# Patient Record
Sex: Male | Born: 1955 | Race: Asian | Hispanic: No | Marital: Married | Smoking: Never smoker
Health system: Southern US, Community
[De-identification: ages and names within clinical notes are randomized; demographics above are authoritative.]

## PROBLEM LIST (undated history)

## (undated) DIAGNOSIS — N400 Enlarged prostate without lower urinary tract symptoms: Secondary | ICD-10-CM

## (undated) DIAGNOSIS — R972 Elevated prostate specific antigen [PSA]: Secondary | ICD-10-CM

## (undated) DIAGNOSIS — I10 Essential (primary) hypertension: Secondary | ICD-10-CM

## (undated) DIAGNOSIS — N179 Acute kidney failure, unspecified: Secondary | ICD-10-CM

## (undated) DIAGNOSIS — R7303 Prediabetes: Secondary | ICD-10-CM

## (undated) HISTORY — PX: OTHER SURGICAL HISTORY: SHX169

---

## 2008-09-16 HISTORY — PX: COLONOSCOPY: SHX174

## 2009-06-05 ENCOUNTER — Ambulatory Visit: Payer: Self-pay | Admitting: Gastroenterology

## 2013-08-19 ENCOUNTER — Ambulatory Visit: Payer: Self-pay | Admitting: Internal Medicine

## 2015-04-06 ENCOUNTER — Encounter: Payer: Self-pay | Admitting: Urology

## 2015-04-06 ENCOUNTER — Telehealth: Payer: Self-pay | Admitting: Urology

## 2015-04-06 NOTE — Telephone Encounter (Signed)
Received referral to urology from [facility] for elevated PSA's. Called patient to schedule appointment. No answer. Left message for him to call back to schedule same.

## 2015-07-07 ENCOUNTER — Ambulatory Visit: Payer: Self-pay | Admitting: Urology

## 2015-07-13 ENCOUNTER — Ambulatory Visit (INDEPENDENT_AMBULATORY_CARE_PROVIDER_SITE_OTHER): Payer: BLUE CROSS/BLUE SHIELD | Admitting: Urology

## 2015-07-13 VITALS — BP 144/81 | HR 83 | Ht 70.0 in | Wt 160.9 lb

## 2015-07-13 DIAGNOSIS — R972 Elevated prostate specific antigen [PSA]: Secondary | ICD-10-CM | POA: Diagnosis not present

## 2015-07-13 LAB — MICROSCOPIC EXAMINATION
Bacteria, UA: NONE SEEN
Epithelial Cells (non renal): NONE SEEN /hpf (ref 0–10)
RBC MICROSCOPIC, UA: NONE SEEN /HPF (ref 0–?)
Renal Epithel, UA: NONE SEEN /hpf
WBC UA: NONE SEEN /HPF (ref 0–?)

## 2015-07-13 LAB — URINALYSIS, COMPLETE
Bilirubin, UA: NEGATIVE
GLUCOSE, UA: NEGATIVE
KETONES UA: NEGATIVE
Leukocytes, UA: NEGATIVE
NITRITE UA: NEGATIVE
Protein, UA: NEGATIVE
RBC UA: NEGATIVE
SPEC GRAV UA: 1.015 (ref 1.005–1.030)
UUROB: 0.2 mg/dL (ref 0.2–1.0)
pH, UA: 6 (ref 5.0–7.5)

## 2015-07-13 NOTE — Progress Notes (Signed)
07/13/2015 9:15 AM   Oletta Darter 1956/08/09 [id number]  Referring provider: Sherrin Daisy, MD [place], [place] [id number]  Chief Complaint  Patient presents with  . Elevated PSA    New Patient    HPI: The patient is a 59 year old male who presents with an elevated PSA of 4.42.     PMH: History reviewed. No pertinent past medical history.  Surgical History: Past Surgical History  Procedure Laterality Date  . Colonoscopy  2010    polyp removal  . Polyp removal      Home Medications:    Medication List    Notice  As of 07/13/2015  9:15 AM   You have not been prescribed any medications.      Allergies: No Known Allergies  Family History: Family History  Problem Relation Age of Onset  . Prostate cancer Neg Hx   . Bladder Cancer Neg Hx   . Kidney cancer Neg Hx   . Urolithiasis Neg Hx     Social History:  reports that he has never smoked. He does not have any smokeless tobacco history on file. He reports that he does not drink alcohol or use illicit drugs.  ROS: UROLOGY Frequent Urination?: Yes Hard to postpone urination?: No Burning/pain with urination?: No Get up at night to urinate?: Yes Leakage of urine?: No Urine stream starts and stops?: No Trouble starting stream?: No Do you have to strain to urinate?: No Blood in urine?: No Urinary tract infection?: No Sexually transmitted disease?: No Injury to kidneys or bladder?: No Painful intercourse?: No Weak stream?: No Erection problems?: No Penile pain?: No  Gastrointestinal Nausea?: No Vomiting?: No Indigestion/heartburn?: No Diarrhea?: No Constipation?: No  Constitutional Fever: No Night sweats?: No Weight loss?: No Fatigue?: No  Skin Skin rash/lesions?: No Itching?: No  Eyes Blurred vision?: No Double vision?: No  Ears/Nose/Throat Sore throat?: Yes Sinus problems?: No  Hematologic/Lymphatic Swollen glands?: No Easy bruising?: No  Cardiovascular Leg  swelling?: No Chest pain?: No  Respiratory Cough?: Yes Shortness of breath?: No  Endocrine Excessive thirst?: No  Musculoskeletal Back pain?: No Joint pain?: No  Neurological Headaches?: No Dizziness?: No  Psychologic Depression?: No Anxiety?: No  Physical Exam: BP 144/81 mmHg  Pulse 83  Ht 5\' 10"  (1.778 m)  Wt 160 lb 14.4 oz (72.984 kg)  BMI 23.09 kg/m2  Constitutional:  Alert and oriented, No acute distress. HEENT: [place] AT, moist mucus membranes.  Trachea midline, no masses. Cardiovascular: No clubbing, cyanosis, or edema. Respiratory: Normal respiratory effort, no increased work of breathing. GI: Abdomen is soft, nontender, nondistended, no abdominal masses GU: No CVA tenderness. Normal phallus. Testicles descended equally bilaterally. Nontender to palpation. Patient refuses DRE at this time. Skin: No rashes, bruises or suspicious lesions. Lymph: No cervical or inguinal adenopathy. Neurologic: Grossly intact, no focal deficits, moving all 4 extremities. Psychiatric: Normal mood and affect.  Laboratory Data: No results found for: WBC, HGB, HCT, MCV, PLT  No results found for: CREATININE  No results found for: PSA  No results found for: TESTOSTERONE  No results found for: HGBA1C  Urinalysis No results found for: COLORURINE, APPEARANCEUR, LABSPEC, PHURINE, GLUCOSEU, HGBUR, BILIRUBINUR, KETONESUR, PROTEINUR, UROBILINOGEN, NITRITE, LEUKOCYTESUR  Assessment & Plan:   I discussed with the patient the risks, benefits, and indications of a PSA testing. He is aware that it is a controversial tests with guidelines ranging from not checking PSA to screening healthy individuals between age 30 and 35. I also discussed with him that  the next step if he wished to proceed would be a prostate biopsy. I discussed the risks, benefits, and indications of this including but not limited to bleeding and infection. At this time he wishes not to proceed with a prostate biopsy. We'll  have him follow-up in one year for repeat PSA.  1. Elevated PSA -repeat PSA in one year   Return in about 1 year (around 07/12/2016) for with repeat PSA prior.  Nickie Retort, MD  [facility] [address] [place], [place] [address] [phone]

## 2019-12-06 DIAGNOSIS — R972 Elevated prostate specific antigen [PSA]: Secondary | ICD-10-CM | POA: Insufficient documentation

## 2019-12-06 DIAGNOSIS — R7303 Prediabetes: Secondary | ICD-10-CM | POA: Insufficient documentation

## 2020-01-24 ENCOUNTER — Other Ambulatory Visit: Payer: Self-pay

## 2020-06-16 ENCOUNTER — Encounter: Payer: Self-pay | Admitting: Urology

## 2020-06-16 ENCOUNTER — Other Ambulatory Visit: Payer: Self-pay

## 2020-06-16 ENCOUNTER — Ambulatory Visit (INDEPENDENT_AMBULATORY_CARE_PROVIDER_SITE_OTHER): Payer: Self-pay | Admitting: Urology

## 2020-06-16 VITALS — BP 130/76 | HR 82 | Ht 70.0 in | Wt 155.0 lb

## 2020-06-16 DIAGNOSIS — R972 Elevated prostate specific antigen [PSA]: Secondary | ICD-10-CM

## 2020-06-16 NOTE — Patient Instructions (Signed)

## 2020-06-16 NOTE — Progress Notes (Signed)
06/16/2020 9:28 AM   Mario Valdez 12/27/1955 [id number]  Referring provider: Toni Arthurs, NP [address] [place],  [place] [address]  Chief Complaint  Patient presents with  . Elevated PSA    New Patient    HPI:  Kidney was referred over for rising PSA.  His March 2021 PSA was 7.09 and repeat September 2021 was 8.62. No prostate biopsy. No h/o BPH. No FH of prostate cancer.   He voids with a good stream. He has some frequency. Noc x 1-2. Occ urgency after fluids.   No medications. 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   PMH: History reviewed. No pertinent past medical history.  Surgical History: Past Surgical History:  Procedure Laterality Date  . COLONOSCOPY  2010   polyp removal  . polyp removal      Home Medications:  Allergies as of 06/16/2020   No Known Allergies     Medication List    as of June 16, 2020  9:28 AM   You have not been prescribed any medications.     Allergies: No Known Allergies  Family History: Family History  Problem Relation Age of Onset  . Prostate cancer Neg Hx   . Bladder Cancer Neg Hx   . Kidney cancer Neg Hx   . Urolithiasis Neg Hx     Social History:  reports that he has never smoked. He has never used smokeless tobacco. He reports that he does not drink alcohol and does not use drugs.   Physical Exam: BP 130/76   Pulse 82   Ht 5\' 10"  (1.778 m)   Wt 155 lb (70.3 kg)   BMI 22.24 kg/m   Constitutional:  Alert and oriented, No acute distress. HEENT: [place] AT, moist mucus membranes.  Trachea midline, no masses. Cardiovascular: No clubbing, cyanosis, or edema. Respiratory: Normal respiratory effort, no increased work of breathing. GI: Abdomen is soft, nontender, nondistended, no abdominal masses GU: No CVA tenderness DRE: Prostate 40 g, smooth without hard area or nodule Lymph: No cervical or inguinal lymphadenopathy. Skin: No rashes, bruises or suspicious lesions. Neurologic: Grossly  intact, no focal deficits, moving all 4 extremities. Psychiatric: Normal mood and affect.  Laboratory Data: No results found for: WBC, HGB, HCT, MCV, PLT  No results found for: CREATININE  No results found for: PSA  No results found for: TESTOSTERONE  No results found for: HGBA1C  Urinalysis    Component Value Date/Time   APPEARANCEUR Clear 07/13/2015 0852   GLUCOSEU Negative 07/13/2015 0852   BILIRUBINUR Negative 07/13/2015 0852   PROTEINUR Negative 07/13/2015 0852   NITRITE Negative 07/13/2015 0852   LEUKOCYTESUR Negative 07/13/2015 0852    Lab Results  Component Value Date   LABMICR See below: 07/13/2015   WBCUA None seen 07/13/2015   RBCUA None seen 07/13/2015   LABEPIT None seen 07/13/2015   BACTERIA None seen 07/13/2015    Pertinent Imaging: n/a No results found for this or any previous visit.  No results found for this or any previous visit.  No results found for this or any previous visit.  No results found for this or any previous visit.  No results found for this or any previous visit.  No results found for this or any previous visit.  No results found for this or any previous visit.  No results found for this or any previous visit.   Assessment & Plan:    1. Elevated PSA  I had  a long discussion with the patient on the nature of elevated PSA - benign vs malignant causes. We discussed age specific levels and that PCa can be seen on a biopsy with very low PSA levels (<=2.5). We discussed the nature risks and benefits of continued surveillance, other lab tests, imaging as well as prostate biopsy. We discussed the management of prostate cancer might include active surveillance or treatment depending on biopsy findings. All questions answered. Will schedule prostate biopsy.   - Urinalysis, Complete   No follow-ups on file.  Festus Aloe, MD  [facility] [address] [place], [place] [address] [phone]

## 2020-06-17 LAB — URINALYSIS, COMPLETE
Bilirubin, UA: NEGATIVE
Glucose, UA: NEGATIVE
Ketones, UA: NEGATIVE
Leukocytes,UA: NEGATIVE
Nitrite, UA: NEGATIVE
Protein,UA: NEGATIVE
RBC, UA: NEGATIVE
Specific Gravity, UA: 1.02 (ref 1.005–1.030)
Urobilinogen, Ur: 0.2 mg/dL (ref 0.2–1.0)
pH, UA: 5.5 (ref 5.0–7.5)

## 2020-06-30 ENCOUNTER — Other Ambulatory Visit: Payer: Self-pay | Admitting: Urology

## 2020-07-14 ENCOUNTER — Encounter: Payer: Self-pay | Admitting: Urology

## 2020-07-14 ENCOUNTER — Ambulatory Visit (INDEPENDENT_AMBULATORY_CARE_PROVIDER_SITE_OTHER): Payer: BC Managed Care – PPO | Admitting: Urology

## 2020-07-14 ENCOUNTER — Other Ambulatory Visit: Payer: Self-pay

## 2020-07-14 ENCOUNTER — Ambulatory Visit: Payer: Self-pay | Admitting: Urology

## 2020-07-14 VITALS — BP 131/69 | HR 89 | Ht 70.0 in | Wt 161.0 lb

## 2020-07-14 DIAGNOSIS — R972 Elevated prostate specific antigen [PSA]: Secondary | ICD-10-CM

## 2020-07-14 MED ORDER — LEVOFLOXACIN 500 MG PO TABS
500.0000 mg | ORAL_TABLET | Freq: Once | ORAL | Status: AC
  Administered 2020-07-14: 500 mg via ORAL

## 2020-07-14 MED ORDER — GENTAMICIN SULFATE 40 MG/ML IJ SOLN
80.0000 mg | Freq: Once | INTRAMUSCULAR | Status: AC
  Administered 2020-07-14: 80 mg via INTRAMUSCULAR

## 2020-07-14 NOTE — Progress Notes (Signed)
Mario Valdez returns for PSA elevation for biopsy. His March 2021 PSA was 7.09 and repeat September 2021 was 8.62. No prior prostate biopsy. No h/o BPH. No FH of prostate cancer.   He voids with a good stream. He has some frequency. Noc x 1-2. Occ urgency after fluids.   No medications. 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 He is an Optometrist.   Prostate Biopsy Procedure   Informed consent was obtained after discussing risks/benefits of the procedure.  A time out was performed to ensure correct patient identity.  Pre-Procedure: - Last PSA Level: No results found for: PSA - Gentamicin given prophylactically - Levaquin 500 mg administered PO -Transrectal Ultrasound performed revealing a 40 gm prostate  -No significant hypoechoic areas  -Enlarged transition zone, median lobe c/w BPH -Prostate measured W 5.5 x  H 5.35 x L 6.4 cm for a volume of 98.59 mL.  Procedure: - Prostate block performed using 10 cc 1% lidocaine and biopsies taken from sextant areas, a total of 12 under ultrasound guidance.  Post-Procedure: - Patient tolerated the procedure well - He was counseled to seek immediate medical attention if experiences any severe pain, significant bleeding, or fevers - Return in one - two week to discuss biopsy results  His exam was normal and prostate looks larger on ultrasound with finding of enlarged median lobe and total volume of 99 g.

## 2020-07-14 NOTE — Patient Instructions (Signed)

## 2020-07-17 LAB — SURGICAL PATHOLOGY

## 2020-07-28 ENCOUNTER — Encounter: Payer: Self-pay | Admitting: Urology

## 2020-07-28 ENCOUNTER — Ambulatory Visit (INDEPENDENT_AMBULATORY_CARE_PROVIDER_SITE_OTHER): Payer: BC Managed Care – PPO | Admitting: Urology

## 2020-07-28 ENCOUNTER — Other Ambulatory Visit: Payer: Self-pay

## 2020-07-28 VITALS — BP 131/78 | HR 83 | Ht 70.0 in | Wt 161.0 lb

## 2020-07-28 DIAGNOSIS — N401 Enlarged prostate with lower urinary tract symptoms: Secondary | ICD-10-CM | POA: Diagnosis not present

## 2020-07-28 DIAGNOSIS — N138 Other obstructive and reflux uropathy: Secondary | ICD-10-CM | POA: Diagnosis not present

## 2020-07-28 DIAGNOSIS — R972 Elevated prostate specific antigen [PSA]: Secondary | ICD-10-CM | POA: Diagnosis not present

## 2020-07-28 DIAGNOSIS — R350 Frequency of micturition: Secondary | ICD-10-CM

## 2020-07-28 NOTE — Progress Notes (Signed)
07/28/2020 10:07 AM   Mario Valdez 03-Oct-1955 [id number]  Referring provider: Sherrin Daisy, MD No address on file  No chief complaint on file.   HPI:  F/u -   1) PSA elevation - biopsy done 07/14/2020 benign, 99 gram prostate. No FH of PCa.   His March 2021 PSA was 7.09 and repeat September 2021 was 8.62.    2) BPH - prostate 99 g on [place] 10/21. He voids with a good stream. He has some frequency. Noc x 1-2. Occ urgency after fluids.   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 He had some blood in semen - clearing.    PMH: No past medical history on file.  Surgical History: Past Surgical History:  Procedure Laterality Date  . COLONOSCOPY  2010   polyp removal  . polyp removal      Home Medications:  Allergies as of 07/28/2020   No Known Allergies     Medication List    as of July 28, 2020 10:07 AM   You have not been prescribed any medications.     Allergies: No Known Allergies  Family History: Family History  Problem Relation Age of Onset  . Prostate cancer Neg Hx   . Bladder Cancer Neg Hx   . Kidney cancer Neg Hx   . Urolithiasis Neg Hx     Social History:  reports that he has never smoked. He has never used smokeless tobacco. He reports that he does not drink alcohol and does not use drugs.   Physical Exam: There were no vitals taken for this visit.  Constitutional:  Alert and oriented, No acute distress. HEENT: [place] AT, moist mucus membranes.  Trachea midline, no masses. Cardiovascular: No clubbing, cyanosis, or edema. Respiratory: Normal respiratory effort, no increased work of breathing. GI: Abdomen is soft, nontender, nondistended, no abdominal masses GU: No CVA tenderness Lymph: No cervical or inguinal lymphadenopathy. Skin: No rashes, bruises or suspicious lesions. Neurologic: Grossly intact, no focal deficits, moving all 4 extremities. Psychiatric: Normal mood and  affect.  Laboratory Data: No results found for: WBC, HGB, HCT, MCV, PLT  No results found for: CREATININE  No results found for: PSA  No results found for: TESTOSTERONE  No results found for: HGBA1C  Urinalysis    Component Value Date/Time   APPEARANCEUR Clear 06/16/2020 0929   GLUCOSEU Negative 06/16/2020 0929   BILIRUBINUR Negative 06/16/2020 0929   PROTEINUR Negative 06/16/2020 0929   NITRITE Negative 06/16/2020 0929   LEUKOCYTESUR Negative 06/16/2020 0929    Lab Results  Component Value Date   LABMICR See below: 07/13/2015   WBCUA None seen 07/13/2015   RBCUA None seen 07/13/2015   LABEPIT None seen 07/13/2015   BACTERIA None seen 07/13/2015    Pertinent Imaging: N/A No results found for this or any previous visit.  No results found for this or any previous visit.  No results found for this or any previous visit.  No results found for this or any previous visit.  No results found for this or any previous visit.  No results found for this or any previous visit.  No results found for this or any previous visit.  No results found for this or any previous visit.   Assessment & Plan:    Elevated PSA-we discussed benign biopsy results.  We discussed importance of continued PSA surveillance and DRE. F/u in 6 mo with PSA prior.   BPH-we discussed the nature  of BPH and the nature risk and benefits of surveillance, medications and procedures.  All questions answered.  He will continue surveillance.  No follow-ups on file.  Festus Aloe, MD  [facility] [address] [place], [place] [address] [phone]

## 2020-07-28 NOTE — Patient Instructions (Signed)

## 2021-01-02 ENCOUNTER — Other Ambulatory Visit: Payer: Self-pay | Admitting: *Deleted

## 2021-01-02 DIAGNOSIS — R972 Elevated prostate specific antigen [PSA]: Secondary | ICD-10-CM

## 2021-01-24 ENCOUNTER — Other Ambulatory Visit: Payer: Self-pay

## 2021-01-26 ENCOUNTER — Ambulatory Visit: Payer: Self-pay | Admitting: Urology

## 2021-04-14 ENCOUNTER — Other Ambulatory Visit: Payer: Self-pay

## 2021-04-14 ENCOUNTER — Emergency Department: Payer: BC Managed Care – PPO

## 2021-04-14 ENCOUNTER — Emergency Department
Admission: EM | Admit: 2021-04-14 | Discharge: 2021-04-14 | Disposition: A | Discharge status: Home / Self Care | Payer: BC Managed Care – PPO | Attending: Emergency Medicine | Admitting: Emergency Medicine

## 2021-04-14 ENCOUNTER — Encounter: Payer: Self-pay | Admitting: Emergency Medicine

## 2021-04-14 DIAGNOSIS — S43112A Subluxation of left acromioclavicular joint, initial encounter: Secondary | ICD-10-CM | POA: Diagnosis not present

## 2021-04-14 DIAGNOSIS — M25512 Pain in left shoulder: Secondary | ICD-10-CM | POA: Insufficient documentation

## 2021-04-14 DIAGNOSIS — Y92410 Unspecified street and highway as the place of occurrence of the external cause: Secondary | ICD-10-CM | POA: Insufficient documentation

## 2021-04-14 DIAGNOSIS — S43102A Unspecified dislocation of left acromioclavicular joint, initial encounter: Secondary | ICD-10-CM

## 2021-04-14 DIAGNOSIS — S4992XA Unspecified injury of left shoulder and upper arm, initial encounter: Secondary | ICD-10-CM | POA: Diagnosis present

## 2021-04-14 DIAGNOSIS — S60511A Abrasion of right hand, initial encounter: Secondary | ICD-10-CM | POA: Insufficient documentation

## 2021-04-14 MED ORDER — OXYCODONE-ACETAMINOPHEN 5-325 MG PO TABS: 1.0000 | ORAL_TABLET | Freq: Four times a day (QID) | ORAL | 0 refills | Status: DC | PRN

## 2021-04-14 MED ORDER — OXYCODONE-ACETAMINOPHEN 5-325 MG PO TABS
1.0000 | ORAL_TABLET | Freq: Once | ORAL | Status: AC
  Administered 2021-04-14: 1 via ORAL
  Filled 2021-04-14: qty 1
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
## 2021-04-14 NOTE — Discharge Instructions (Addendum)
Call make a follow-up appointment with Dr. Mack Guise who is the orthopedist on-call.  His address and contact information is listed on your discharge papers.  This is also the person that you would talk to her about having surgery on your injured finger that occurred with playing with your grandchild.  Ice pack to your shoulder as needed for discomfort.  Pain medication was also sent to your pharmacy.  This is 1 every 6 hours as needed for pain.  Be aware that this medication could cause drowsiness and increase your risk for injury.  Do not drive or operate machinery while taking this medication.
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
## 2021-05-09 DIAGNOSIS — M25512 Pain in left shoulder: Secondary | ICD-10-CM | POA: Insufficient documentation

## 2021-12-18 ENCOUNTER — Encounter: Payer: Self-pay | Admitting: *Deleted

## 2022-01-08 ENCOUNTER — Ambulatory Visit (INDEPENDENT_AMBULATORY_CARE_PROVIDER_SITE_OTHER): Payer: BC Managed Care – PPO | Admitting: Urology

## 2022-01-08 ENCOUNTER — Encounter: Payer: Self-pay | Admitting: Urology

## 2022-01-08 VITALS — Ht 70.0 in | Wt 165.0 lb

## 2022-01-08 DIAGNOSIS — R972 Elevated prostate specific antigen [PSA]: Secondary | ICD-10-CM | POA: Diagnosis not present

## 2022-01-08 NOTE — Progress Notes (Signed)
? ?  01/08/2022 ?1:11 PM  ? ?Mario Valdez ?Oct 01, 1955 ?[id number] ? ?Reason for visit: Follow up elevated PSA, BPH ? ?HPI: ?66 year old male with a long history of elevated PSA who was previously followed by Dr. Junious Silk.  He underwent a prostate biopsy in October 2021 for an elevated PSA of 8.6, which showed a 100 g prostate with all benign tissue.  Dr. Junious Silk recommended 28-month follow-up at that time, but he has not been seen since then. ? ?Most recent PSA 12/10/2021 remained elevated at 12.34, which had increased from his prior value of 8.75 in March 2022. ? ?He denies any significant urinary symptoms aside from some urinary frequency that he has had long-term and is minimally bothersome.  He can have nocturia 1-3 times overnight, but again is minimally bothered. ? ?We reviewed the implications of an elevated PSA and the uncertainty surrounding it. In general, a man's PSA increases with age and is produced by both normal and cancerous prostate tissue. The differential diagnosis for elevated PSA includes BPH, prostate cancer, infection, recent intercourse/ejaculation, recent urethroscopic manipulation (foley placement/cystoscopy) or trauma, and prostatitis.  ? ?Management of an elevated PSA can include observation or prostate biopsy and we discussed this in detail.  We also discussed the role of prostate MRI.  Our goal is to detect clinically significant prostate cancers, and manage with either active surveillance, surgery, or radiation for localized disease. Risks of prostate biopsy include bleeding, infection (including life threatening sepsis), pain, and lower urinary symptoms. Hematuria, hematospermia, and blood in the stool are all common after biopsy and can persist up to 4 weeks.  ? ?We discussed options including a repeat PSA with reflex to free in 6 months or proceeding with prostate MRI.  He has a negative prostate biopsy in 2021 that showed a 100 g prostate which is relatively reassuring in terms of his  PSA density.  Using shared decision making, he opted for repeat PSA with reflex to free in 6 months, and understands the need for potential prostate MRI in the future if PSA continues to rise ? ? ?Billey Co, MD ? ?[facility] ?[address] ?[place], [place] [address] ?[phone] ? ?

## 2022-01-08 NOTE — Patient Instructions (Signed)
Prostate Cancer Screening ? ?Prostate cancer screening is testing that is done to check for the presence of prostate cancer in men. The prostate gland is a walnut-sized gland that is located below the bladder and in front of the rectum in males. The function of the prostate is to add fluid to semen during ejaculation. Prostate cancer is one of the most common types of cancer in men. ?Who should have prostate cancer screening? ?Screening recommendations vary based on age and other risk factors, as well as between the professional organizations who make the recommendations. ?In general, screening is recommended if: ?You are age 1 to 2 and have an average risk for prostate cancer. You should talk with your health care provider about your need for screening and how often screening should be done. Because most prostate cancers are slow growing and will not cause death, screening in this age group is generally reserved for men who have a 101- to 15-year life expectancy. ?You are younger than age 54, and you have these risk factors: ?Having a father, brother, or uncle who has been diagnosed with prostate cancer. The risk is higher if your family member's cancer occurred at an early age or if you have multiple family members with prostate cancer at an early age. ?Being a male who is [place] or is of [place] or sub-Saharan African descent. ?In general, screening is not recommended if: ?You are younger than age 75. ?You are between the ages of 71 and 50 and you have no risk factors. ?You are 81 years of age or older. At this age, the risks that screening can cause are greater than the benefits that it may provide. ?If you are at high risk for prostate cancer, your health care provider may recommend that you have screenings more often or that you start screening at a younger age. ?How is screening for prostate cancer done? ?The recommended prostate cancer screening test is a blood test called the prostate-specific antigen  (PSA) test. PSA is a protein that is made in the prostate. As you age, your prostate naturally produces more PSA. Abnormally high PSA levels may be caused by: ?Prostate cancer. ?An enlarged prostate that is not caused by cancer (benign prostatic hyperplasia, or BPH). This condition is very common in older men. ?A prostate gland infection (prostatitis) or urinary tract infection. ?Certain medicines such as male hormones (like testosterone) or other medicines that raise testosterone levels. ?A rectal exam may be done as part of prostate cancer screening to help provide information about the size of your prostate gland. When a rectal exam is performed, it should be done after the PSA level is drawn to avoid any effect on the results. ?Depending on the PSA results, you may need more tests, such as: ?A physical exam to check the size of your prostate gland, if not done as part of screening. ?Blood and imaging tests. ?A procedure to remove tissue samples from your prostate gland for testing (biopsy). This is the only way to know for certain if you have prostate cancer. ?What are the benefits of prostate cancer screening? ?Screening can help to identify cancer at an early stage, before symptoms start and when the cancer can be treated more easily. ?There is a small chance that screening may lower your risk of dying from prostate cancer. The chance is small because prostate cancer is a slow-growing cancer, and most men with prostate cancer die from a different cause. ?What are the risks of prostate cancer screening? ?The  main risk of prostate cancer screening is diagnosing and treating prostate cancer that would never have caused any symptoms or problems. This is called overdiagnosisand overtreatment. PSA screening cannot tell you if your PSA is high due to cancer or a different cause. A prostate biopsy is the only procedure to diagnose prostate cancer. Even the results of a biopsy may not tell you if your cancer needs to  be treated. Slow-growing prostate cancer may not need any treatment other than monitoring, so diagnosing and treating it may cause unnecessary stress or other side effects. ?Questions to ask your health care provider ?When should I start prostate cancer screening? ?What is my risk for prostate cancer? ?How often do I need screening? ?What type of screening tests do I need? ?How do I get my test results? ?What do my results mean? ?Do I need treatment? ?Where to find more information ?The American Cancer Society: www.cancer.org ?American Urological Association: www.auanet.org ?Contact a health care provider if: ?You have difficulty urinating. ?You have pain when you urinate or ejaculate. ?You have blood in your urine or semen. ?You have pain in your back or in the area of your prostate. ?Summary ?Prostate cancer is a common type of cancer in men. The prostate gland is located below the bladder and in front of the rectum. This gland adds fluid to semen during ejaculation. ?Prostate cancer screening may identify cancer at an early stage, when the cancer can be treated more easily and is less likely to have spread to other areas of the body. ?The prostate-specific antigen (PSA) test is the recommended screening test for prostate cancer, but it has associated risks. ?Discuss the risks and benefits of prostate cancer screening with your health care provider. If you are age 29 or older, the risks that screening can cause are greater than the benefits that it may provide. ?This information is not intended to replace advice given to you by your health care provider. Make sure you discuss any questions you have with your health care provider. ?Document Revised: 02/26/2021 Document Reviewed: 02/26/2021 ?Elsevier Patient Education ? [place]. ? ? ?Prostate-Specific Antigen Test ?Why am I having this test? ?The prostate-specific antigen (PSA) test is a screening test for prostate cancer. It can identify early signs of  prostate cancer, which may allow for early detection and more effective treatment. Your health care provider may recommend that you have a PSA test starting at age 44 or that you have one earlier if you are at higher risk for prostate cancer. You may also have a PSA test: ?To monitor treatment of prostate cancer. ?To check whether prostate cancer has returned after treatment. ?What is being tested? ?This test measures the amount of PSA in your blood. PSA is a protein that is made in the prostate. The prostate naturally produces more PSA as you age, but very high levels may be a sign of a medical condition. ?What kind of sample is taken? ? ?A blood sample is required for this test. It is usually collected by inserting a needle into a blood vessel but can also be collected by sticking a finger with a small needle. Blood for this test should be drawn before having an exam of the prostate that involves digital rectal examination to avoid affecting the results. ?How do I prepare for this test? ?Do not ejaculate starting 24 hours before your test, or as long as told by your health care provider, as this can cause an elevation in PSA. ?Do not  undergo any procedures that require manipulation of the prostate, such as biopsy or surgery, for 6 weeks before the test is done as this can cause an elevation in PSA. ?Tell a health care provider about: ?Any signs you may have of other conditions that can affect PSA levels, such as: ?An enlarged prostate that is not caused by cancer (benign prostatic hyperplasia, or BPH). This condition is very common in older men. ?A prostate or urinary tract infection. ?Any allergies you have. ?All medicines you are taking, including vitamins, herbs, eye drops, creams, and over-the-counter medicines. This also includes: ?Medicines to assist with hair growth, such as finasteride. ?Any recent exposure to a medicine called diethylstilbestrol (DES). ?Medicines such as male hormones (like testosterone)  or other medicines that raise testosterone levels. ?Any bleeding problems you have. ?Any recent procedures you have had, especially any procedures involving the prostate or rectum. ?Any medical conditions y

## 2022-04-22 ENCOUNTER — Other Ambulatory Visit (HOSPITAL_COMMUNITY): Payer: Self-pay | Admitting: Gastroenterology

## 2022-04-22 ENCOUNTER — Other Ambulatory Visit: Payer: Self-pay | Admitting: Gastroenterology

## 2022-04-22 DIAGNOSIS — R198 Other specified symptoms and signs involving the digestive system and abdomen: Secondary | ICD-10-CM

## 2022-05-01 ENCOUNTER — Ambulatory Visit: Payer: BC Managed Care – PPO
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
## 2022-06-10 ENCOUNTER — Inpatient Hospital Stay: Admission: RE | Admit: 2022-06-10 | Payer: BC Managed Care – PPO | Source: Ambulatory Visit

## 2022-06-13 DIAGNOSIS — I10 Essential (primary) hypertension: Secondary | ICD-10-CM | POA: Diagnosis present

## 2022-06-18 ENCOUNTER — Ambulatory Visit
Admission: RE | Admit: 2022-06-18 | Discharge: 2022-06-18 | Disposition: A | Discharge status: Home / Self Care | Payer: BC Managed Care – PPO | Source: Ambulatory Visit | Attending: Gastroenterology | Admitting: Gastroenterology

## 2022-06-18 DIAGNOSIS — R198 Other specified symptoms and signs involving the digestive system and abdomen: Secondary | ICD-10-CM

## 2022-06-26 ENCOUNTER — Other Ambulatory Visit: Payer: Self-pay | Admitting: Nephrology

## 2022-06-26 DIAGNOSIS — N189 Chronic kidney disease, unspecified: Secondary | ICD-10-CM | POA: Diagnosis present

## 2022-06-26 DIAGNOSIS — N1339 Other hydronephrosis: Secondary | ICD-10-CM

## 2022-06-26 DIAGNOSIS — N179 Acute kidney failure, unspecified: Secondary | ICD-10-CM | POA: Diagnosis present

## 2022-06-26 DIAGNOSIS — N400 Enlarged prostate without lower urinary tract symptoms: Secondary | ICD-10-CM | POA: Insufficient documentation

## 2022-06-27 ENCOUNTER — Telehealth: Payer: Self-pay | Admitting: *Deleted

## 2022-06-27 ENCOUNTER — Inpatient Hospital Stay
Admission: EM | Admit: 2022-06-27 | Discharge: 2022-06-30 | DRG: 683 | Disposition: A | Discharge status: Home / Self Care | Payer: BC Managed Care – PPO | Attending: Internal Medicine | Admitting: Internal Medicine

## 2022-06-27 ENCOUNTER — Other Ambulatory Visit: Payer: Self-pay

## 2022-06-27 DIAGNOSIS — R319 Hematuria, unspecified: Secondary | ICD-10-CM | POA: Diagnosis present

## 2022-06-27 DIAGNOSIS — I10 Essential (primary) hypertension: Secondary | ICD-10-CM | POA: Diagnosis not present

## 2022-06-27 DIAGNOSIS — N401 Enlarged prostate with lower urinary tract symptoms: Secondary | ICD-10-CM

## 2022-06-27 DIAGNOSIS — R7303 Prediabetes: Secondary | ICD-10-CM | POA: Diagnosis present

## 2022-06-27 DIAGNOSIS — N400 Enlarged prostate without lower urinary tract symptoms: Secondary | ICD-10-CM | POA: Diagnosis present

## 2022-06-27 DIAGNOSIS — D631 Anemia in chronic kidney disease: Secondary | ICD-10-CM | POA: Diagnosis present

## 2022-06-27 DIAGNOSIS — Z20822 Contact with and (suspected) exposure to covid-19: Secondary | ICD-10-CM | POA: Diagnosis present

## 2022-06-27 DIAGNOSIS — N184 Chronic kidney disease, stage 4 (severe): Secondary | ICD-10-CM | POA: Diagnosis present

## 2022-06-27 DIAGNOSIS — I129 Hypertensive chronic kidney disease with stage 1 through stage 4 chronic kidney disease, or unspecified chronic kidney disease: Secondary | ICD-10-CM | POA: Diagnosis present

## 2022-06-27 DIAGNOSIS — N138 Other obstructive and reflux uropathy: Secondary | ICD-10-CM | POA: Diagnosis present

## 2022-06-27 DIAGNOSIS — M898X9 Other specified disorders of bone, unspecified site: Secondary | ICD-10-CM | POA: Diagnosis present

## 2022-06-27 DIAGNOSIS — R350 Frequency of micturition: Secondary | ICD-10-CM | POA: Diagnosis present

## 2022-06-27 DIAGNOSIS — N320 Bladder-neck obstruction: Principal | ICD-10-CM | POA: Diagnosis present

## 2022-06-27 DIAGNOSIS — R899 Unspecified abnormal finding in specimens from other organs, systems and tissues: Secondary | ICD-10-CM | POA: Diagnosis present

## 2022-06-27 DIAGNOSIS — N189 Chronic kidney disease, unspecified: Secondary | ICD-10-CM | POA: Diagnosis present

## 2022-06-27 DIAGNOSIS — N179 Acute kidney failure, unspecified: Principal | ICD-10-CM | POA: Diagnosis present

## 2022-06-27 LAB — COMPREHENSIVE METABOLIC PANEL
ALT: 11 U/L (ref 0–44)
AST: 18 U/L (ref 15–41)
Albumin: 4.5 g/dL (ref 3.5–5.0)
Alkaline Phosphatase: 45 U/L (ref 38–126)
Anion gap: 7 (ref 5–15)
BUN: 51 mg/dL — ABNORMAL HIGH (ref 8–23)
CO2: 20 mmol/L — ABNORMAL LOW (ref 22–32)
Calcium: 9.3 mg/dL (ref 8.9–10.3)
Chloride: 114 mmol/L — ABNORMAL HIGH (ref 98–111)
Creatinine, Ser: 4.26 mg/dL — ABNORMAL HIGH (ref 0.61–1.24)
GFR, Estimated: 15 mL/min — ABNORMAL LOW (ref >60)
Glucose, Bld: 102 mg/dL — ABNORMAL HIGH (ref 70–99)
Potassium: 5 mmol/L (ref 3.5–5.1)
Sodium: 141 mmol/L (ref 135–145)
Total Bilirubin: 0.7 mg/dL (ref 0.3–1.2)
Total Protein: 8.2 g/dL — ABNORMAL HIGH (ref 6.5–8.1)

## 2022-06-27 LAB — CBC WITH DIFFERENTIAL/PLATELET
Abs Immature Granulocytes: 0.02 10*3/uL (ref 0.00–0.07)
Basophils Absolute: 0.1 10*3/uL (ref 0.0–0.1)
Basophils Relative: 1 %
Eosinophils Absolute: 0.3 10*3/uL (ref 0.0–0.5)
Eosinophils Relative: 5 %
HCT: 32.1 % — ABNORMAL LOW (ref 39.0–52.0)
Hemoglobin: 10.6 g/dL — ABNORMAL LOW (ref 13.0–17.0)
Immature Granulocytes: 0 %
Lymphocytes Relative: 13 %
Lymphs Abs: 0.7 10*3/uL (ref 0.7–4.0)
MCH: 28 pg (ref 26.0–34.0)
MCHC: 33 g/dL (ref 30.0–36.0)
MCV: 84.7 fL (ref 80.0–100.0)
Monocytes Absolute: 0.5 10*3/uL (ref 0.1–1.0)
Monocytes Relative: 9 %
Neutro Abs: 4.1 10*3/uL (ref 1.7–7.7)
Neutrophils Relative %: 72 %
Platelets: 192 10*3/uL (ref 150–400)
RBC: 3.79 MIL/uL — ABNORMAL LOW (ref 4.22–5.81)
RDW: 12 % (ref 11.5–15.5)
WBC: 5.7 10*3/uL (ref 4.0–10.5)
nRBC: 0 % (ref 0.0–0.2)

## 2022-06-27 LAB — SARS CORONAVIRUS 2 BY RT PCR: SARS Coronavirus 2 by RT PCR: NEGATIVE

## 2022-06-27 MED ORDER — SODIUM CHLORIDE 0.9% FLUSH
3.0000 mL | Freq: Two times a day (BID) | INTRAVENOUS | Status: DC
  Administered 2022-06-27 – 2022-06-30 (×5): 3 mL via INTRAVENOUS

## 2022-06-27 MED ORDER — ALBUTEROL SULFATE (2.5 MG/3ML) 0.083% IN NEBU: 2.5000 mg | INHALATION_SOLUTION | RESPIRATORY_TRACT | Status: DC | PRN

## 2022-06-27 MED ORDER — HEPARIN SODIUM (PORCINE) 5000 UNIT/ML IJ SOLN
5000.0000 [IU] | Freq: Three times a day (TID) | INTRAMUSCULAR | Status: DC
  Administered 2022-06-27: 5000 [IU] via SUBCUTANEOUS
  Filled 2022-06-27 (×2): qty 1

## 2022-06-27 MED ORDER — SODIUM CHLORIDE 0.9 % IV BOLUS
1000.0000 mL | Freq: Once | INTRAVENOUS | Status: AC
  Administered 2022-06-27: 1000 mL via INTRAVENOUS

## 2022-06-27 MED ORDER — LIDOCAINE HCL URETHRAL/MUCOSAL 2 % EX GEL
1.0000 | Freq: Once | CUTANEOUS | Status: AC
  Administered 2022-06-27: 1 via URETHRAL
  Filled 2022-06-27: qty 6

## 2022-06-27 MED ORDER — ACETAMINOPHEN 325 MG PO TABS
650.0000 mg | ORAL_TABLET | Freq: Four times a day (QID) | ORAL | Status: DC | PRN
  Administered 2022-06-28: 650 mg via ORAL
  Filled 2022-06-27: qty 2

## 2022-06-27 MED ORDER — TAMSULOSIN HCL 0.4 MG PO CAPS
0.4000 mg | ORAL_CAPSULE | Freq: Every day | ORAL | Status: DC
  Administered 2022-06-27 – 2022-06-30 (×4): 0.4 mg via ORAL
  Filled 2022-06-27 (×4): qty 1

## 2022-06-27 MED ORDER — ACETAMINOPHEN 650 MG RE SUPP: 650.0000 mg | Freq: Four times a day (QID) | RECTAL | Status: DC | PRN

## 2022-06-27 MED ORDER — LACTATED RINGERS IV SOLN: INTRAVENOUS | Status: AC

## 2022-06-27 MED ORDER — AMLODIPINE BESYLATE 5 MG PO TABS
5.0000 mg | ORAL_TABLET | Freq: Every day | ORAL | Status: DC
  Administered 2022-06-27 – 2022-06-30 (×4): 5 mg via ORAL
  Filled 2022-06-27 (×4): qty 1

## 2022-06-27 MED ORDER — MORPHINE SULFATE (PF) 2 MG/ML IV SOLN
2.0000 mg | INTRAVENOUS | Status: DC | PRN
  Filled 2022-06-27: qty 1

## 2022-06-27 NOTE — ED Triage Notes (Signed)
PT has routine tests run by pcp and they found his kidneys were functioning at 16%. Pt has prostate enlargement and has issues urinary. BP elevated as well.  Pt takes valsartan and amlodipine for bp.

## 2022-06-27 NOTE — ED Provider Notes (Signed)
[facility] Provider Note    Event Date/Time   First MD Initiated Contact with Patient 06/27/22 1726     (approximate)   History   Renal Issue   HPI  Mario Valdez is a 66 y.o. male here with renal failure.  The patient arrives after being told to come in from his outside nephrologist.  The patient has reportedly had increasing urinary frequency and urgency for the last several months.  He was seen by PCP recently for hypertension and had a creatinine of over 2.  He had a recheck yesterday and was over 4.  He had a CT scan as an outpatient which showed severe hydronephrosis so he was told to come in for evaluation.  Patient states he has had persistent frequency and sensation that he needs to pee about every 90 minutes, but this has not necessarily acutely worsened.  He does state that his blood pressure is fairly significantly worsened.  He does continue to make urine.  No fevers or chills.  No flank pain.     Physical Exam   Triage Vital Signs: ED Triage Vitals  Enc Vitals Group     BP 06/27/22 1500 (!) 190/133     Pulse Rate 06/27/22 1500 66     Resp 06/27/22 1500 18     Temp 06/27/22 1500 98.5 F (36.9 C)     Temp src --      SpO2 06/27/22 1500 98 %     Weight 06/27/22 1501 165 lb (74.8 kg)     Height 06/27/22 1741 5\' 10"  (1.778 m)     Head Circumference --      Peak Flow --      Pain Score 06/27/22 1501 0     Pain Loc --      Pain Edu? --      Excl. in [place]? --     Most recent vital signs: Vitals:   06/27/22 1500  BP: (!) 190/133  Pulse: 66  Resp: 18  Temp: 98.5 F (36.9 C)  SpO2: 98%     General: Awake, no distress.  CV:  Good peripheral perfusion.  Resp:  Normal effort.  Abd:  No distention.  Moderate suprapubic tenderness with fullness.  No CVA tenderness bilaterally. Other:  Well-appearing and in no distress.   ED Results / Procedures / Treatments   Labs (all labs ordered are listed, but only abnormal results are  displayed) Labs Reviewed  COMPREHENSIVE METABOLIC PANEL - Abnormal; Notable for the following components:      Result Value   Chloride 114 (*)    CO2 20 (*)    Glucose, Bld 102 (*)    BUN 51 (*)    Creatinine, Ser 4.26 (*)    Total Protein 8.2 (*)    GFR, Estimated 15 (*)    All other components within normal limits  CBC WITH DIFFERENTIAL/PLATELET - Abnormal; Notable for the following components:   RBC 3.79 (*)    Hemoglobin 10.6 (*)    HCT 32.1 (*)    All other components within normal limits  SARS CORONAVIRUS 2 BY RT PCR     EKG    RADIOLOGY CT scan reviewed from 10/2 shows severe hydronephrosis and proximal date enlargement with massive bladder dilation   I also independently reviewed and agree with radiologist interpretations.   PROCEDURES:  Critical Care performed: No    MEDICATIONS ORDERED IN ED: Medications  sodium chloride 0.9 % bolus 1,000 mL (has  no administration in time range)  sodium chloride 0.9 % bolus 1,000 mL (has no administration in time range)  tamsulosin (FLOMAX) capsule 0.4 mg (has no administration in time range)  lidocaine (XYLOCAINE) 2 % jelly 1 Application (1 Application Urethral Given by Other 06/27/22 1941)     IMPRESSION / MDM / ASSESSMENT AND PLAN / ED COURSE  I reviewed the triage vital signs and the nursing notes.                               The patient is on the cardiac monitor to evaluate for evidence of arrhythmia and/or significant heart rate changes.   Ddx:  Differential includes the following, with pertinent life- or limb-threatening emergencies considered:  Bladder outlet obstruction with AKI, BPH, stone, prostatitis, prostate cancer, medication effect, neurogenic bladder  Patient's presentation is most consistent with acute presentation with potential threat to life or bodily function.  MDM:  66 yo M with PMHx BPH here with abdominal distension, renal failure. Pt sent after outside lab work showed severe AKI. CT  scan also obtained, reviewed by me, and shows marked hydro and bladder obstruction/distension. Pt is hypertensive here but o/w HDS. Reviewed imaging, foley placed without difficulty with drainage of >2L urine. IVF given and will admit for hydration and monitoring of renal function in setting of AKI and likely post-atn diuresis. Discussed with Dr. Diamantina [facility] of Urology - recommends foley, f/u as outpt 10/17 which is already arranged. Will start flomax.   MEDICATIONS GIVEN IN ED: Medications  sodium chloride 0.9 % bolus 1,000 mL (has no administration in time range)  sodium chloride 0.9 % bolus 1,000 mL (has no administration in time range)  tamsulosin (FLOMAX) capsule 0.4 mg (has no administration in time range)  lidocaine (XYLOCAINE) 2 % jelly 1 Application (1 Application Urethral Given by Other 06/27/22 1941)     Consults:  Urology Dr. Diamantina Providence Hospitalist   EMR reviewed  Reviewed outpt CT, labs, Urology and PCP notes     FINAL CLINICAL IMPRESSION(S) / ED DIAGNOSES   Final diagnoses:  Bladder outlet obstruction  AKI (acute kidney injury) ([place])  Benign prostatic hyperplasia with urinary frequency     Rx / DC Orders   ED Discharge Orders     None        Note:  This document was prepared using Dragon voice recognition software and may include unintentional dictation errors.   Duffy Bruce, MD 06/27/22 [phone]

## 2022-06-27 NOTE — H&P (Signed)
History and Physical    Chief Complaint: Abnormal lab   HISTORY OF PRESENT ILLNESS: Mario Valdez is an 66 y.o. male coming to the emergency for abnormal blood work. Wife at bedside states that he has absolutely no symptoms except frequency. Patient has not gone to primary care doctor and his doctor in several years.  Does not take any medications.  Pt has PMH as below: History reviewed. No pertinent past medical history.   Review of Systems  Genitourinary:  Positive for frequency.  All other systems reviewed and are negative.   No Known Allergies   Past Surgical History:  Procedure Laterality Date   COLONOSCOPY  2010   polyp removal   polyp removal      Social History   Socioeconomic History   Marital status: Married    Spouse name: Not on file   Number of children: 1   Years of education: Not on file   Highest education level: Not on file  Occupational History   Not on file  Tobacco Use   Smoking status: Never   Smokeless tobacco: Never  Substance and Sexual Activity   Alcohol use: No    Alcohol/week: 0.0 standard drinks of alcohol   Drug use: No   Sexual activity: Yes    Birth control/protection: None  Other Topics Concern   Not on file  Social History Narrative   Not on file   Social Determinants of Health   Financial Resource Strain: Not on file  Food Insecurity: Not on file  Transportation Needs: Not on file  Physical Activity: Not on file  Stress: Not on file  Social Connections: Not on file      CURRENT MEDS:    Current Facility-Administered Medications (Cardiovascular):    amLODipine (NORVASC) tablet 5 mg  Current Outpatient Medications (Cardiovascular):    valsartan (DIOVAN) 320 MG tablet, Take 320 mg by mouth daily.   amLODipine (NORVASC) 5 MG tablet, Take 5 mg by mouth daily.  Current Facility-Administered Medications (Respiratory):    albuterol (PROVENTIL) (2.5 MG/3ML) 0.083% nebulizer solution 2.5 mg   Current  Facility-Administered Medications (Analgesics):    acetaminophen (TYLENOL) tablet 650 mg **OR** acetaminophen (TYLENOL) suppository 650 mg   morphine (PF) 2 MG/ML injection 2 mg   Current Facility-Administered Medications (Hematological):    heparin injection 5,000 Units   Current Facility-Administered Medications (Other):    lactated ringers infusion   sodium chloride flush (NS) 0.9 % injection 3 mL   tamsulosin (FLOMAX) capsule 0.4 mg     ED Course: Pt in Ed alert awake oriented afebrile no apparent distress. Vitals:   06/27/22 2345 06/28/22 0000 06/28/22 0015 06/28/22 0030  BP:  (!) 157/99  (!) 148/89  Pulse: 87 85 91 76  Resp:    18  Temp:      SpO2: 99% 100% 100% 100%  Weight:      Height:       Total I/O In: -  Out: 2850 [Urine:2850] SpO2: 100 % Blood work in ed shows patient with acute kidney injury with a creatinine of 4.6, LFTs within normal limits. Anemia with a hemoglobin of 10.6. Results for orders placed or performed during the hospital encounter of 06/27/22 (from the past 24 hour(s))  Comprehensive metabolic panel     Status: Abnormal   Collection Time: 06/27/22  3:07 PM  Result Value Ref Range   Sodium 141 135 - 145 mmol/L   Potassium 5.0 3.5 - 5.1 mmol/L   Chloride 114 (H) 98 -  111 mmol/L   CO2 20 (L) 22 - 32 mmol/L   Glucose, Bld 102 (H) 70 - 99 mg/dL   BUN 51 (H) 8 - 23 mg/dL   Creatinine, Ser 4.26 (H) 0.61 - 1.24 mg/dL   Calcium 9.3 8.9 - 10.3 mg/dL   Total Protein 8.2 (H) 6.5 - 8.1 g/dL   Albumin 4.5 3.5 - 5.0 g/dL   AST 18 15 - 41 U/L   ALT 11 0 - 44 U/L   Alkaline Phosphatase 45 38 - 126 U/L   Total Bilirubin 0.7 0.3 - 1.2 mg/dL   GFR, Estimated 15 (L) >60 mL/min   Anion gap 7 5 - 15  CBC with Differential     Status: Abnormal   Collection Time: 06/27/22  3:07 PM  Result Value Ref Range   WBC 5.7 4.0 - 10.5 K/uL   RBC 3.79 (L) 4.22 - 5.81 MIL/uL   Hemoglobin 10.6 (L) 13.0 - 17.0 g/dL   HCT 32.1 (L) 39.0 - 52.0 %   MCV 84.7 80.0 -  100.0 fL   MCH 28.0 26.0 - 34.0 pg   MCHC 33.0 30.0 - 36.0 g/dL   RDW 12.0 11.5 - 15.5 %   Platelets 192 150 - 400 K/uL   nRBC 0.0 0.0 - 0.2 %   Neutrophils Relative % 72 %   Neutro Abs 4.1 1.7 - 7.7 K/uL   Lymphocytes Relative 13 %   Lymphs Abs 0.7 0.7 - 4.0 K/uL   Monocytes Relative 9 %   Monocytes Absolute 0.5 0.1 - 1.0 K/uL   Eosinophils Relative 5 %   Eosinophils Absolute 0.3 0.0 - 0.5 K/uL   Basophils Relative 1 %   Basophils Absolute 0.1 0.0 - 0.1 K/uL   Immature Granulocytes 0 %   Abs Immature Granulocytes 0.02 0.00 - 0.07 K/uL  Hemoglobin A1c     Status: Abnormal   Collection Time: 06/27/22  3:29 PM  Result Value Ref Range   Hgb A1c MFr Bld 5.7 (H) 4.8 - 5.6 %   Mean Plasma Glucose 116.89 mg/dL  SARS Coronavirus 2 by RT PCR (hospital order, performed in [place] hospital lab) *cepheid single result test* Anterior Nasal Swab     Status: None   Collection Time: 06/27/22  7:37 PM   Specimen: Anterior Nasal Swab  Result Value Ref Range   SARS Coronavirus 2 by RT PCR NEGATIVE NEGATIVE   In Ed pt received: Meds ordered this encounter  Medications   lidocaine (XYLOCAINE) 2 % jelly 1 Application   sodium chloride 0.9 % bolus 1,000 mL   sodium chloride 0.9 % bolus 1,000 mL   tamsulosin (FLOMAX) capsule 0.4 mg   amLODipine (NORVASC) tablet 5 mg   sodium chloride flush (NS) 0.9 % injection 3 mL   OR Linked Order Group    acetaminophen (TYLENOL) tablet 650 mg    acetaminophen (TYLENOL) suppository 650 mg   morphine (PF) 2 MG/ML injection 2 mg   heparin injection 5,000 Units   lactated ringers infusion   albuterol (PROVENTIL) (2.5 MG/3ML) 0.083% nebulizer solution 2.5 mg    Unresulted Labs (From admission, onward)     Start     Ordered   06/28/22 0500  Comprehensive metabolic panel  Tomorrow morning,   STAT        06/27/22 2031   06/28/22 0500  CBC  Tomorrow morning,   STAT        06/27/22 2031   06/28/22 0500  HIV Antibody (routine  testing w rflx)  Once,   R         06/28/22 0500           Admission Imaging : No results found.  Physical Examination: Vitals:   06/27/22 2345 06/28/22 0000 06/28/22 0015 06/28/22 0030  BP:  (!) 157/99  (!) 148/89  Pulse: 87 85 91 76  Temp:      Resp:    18  Height:      Weight:      SpO2: 99% 100% 100% 100%  BMI (Calculated):       Physical Exam Vitals and nursing note reviewed.  Constitutional:      General: He is not in acute distress.    Appearance: Normal appearance. He is not ill-appearing, toxic-appearing or diaphoretic.  HENT:     Head: Normocephalic and atraumatic.     Right Ear: Hearing and external ear normal.     Left Ear: Hearing and external ear normal.     Nose: Nose normal. No nasal deformity.     Mouth/Throat:     Lips: Pink.     Mouth: Mucous membranes are moist.     Tongue: No lesions.     Pharynx: Oropharynx is clear.  Eyes:     Extraocular Movements: Extraocular movements intact.     Pupils: Pupils are equal, round, and reactive to light.  Neck:     Vascular: No carotid bruit.  Cardiovascular:     Rate and Rhythm: Normal rate and regular rhythm.     Pulses: Normal pulses.     Heart sounds: Normal heart sounds.  Pulmonary:     Effort: Pulmonary effort is normal.     Breath sounds: Normal breath sounds.  Abdominal:     General: Bowel sounds are normal. There is no distension.     Palpations: Abdomen is soft. There is no mass.     Tenderness: There is no abdominal tenderness. There is no guarding.     Hernia: No hernia is present.  Musculoskeletal:     Right lower leg: No edema.     Left lower leg: No edema.  Skin:    General: Skin is warm.  Neurological:     General: No focal deficit present.     Mental Status: He is alert and oriented to person, place, and time.     Cranial Nerves: Cranial nerves 2-12 are intact.     Motor: Motor function is intact.  Psychiatric:        Attention and Perception: Attention normal.        Mood and Affect: Mood normal.         Speech: Speech normal.        Behavior: Behavior normal. Behavior is cooperative.        Cognition and Memory: Cognition normal.      Assessment and Plan: * Abnormal laboratory test Patient presenting to [place] with an abnormal lab. Patient was told that his kidneys failure and to come to the hospital. Lab Results  Component Value Date   CREATININE 4.26 (H) 06/27/2022  . Attribute to Diovan related acute kidney injury along with BPH. Patient advised to refrain from using any NSAIDs and also Diovan. Advised about not using NSAIDs prevent further blood pressures reviewed years.   Hypertension Vitals:   06/27/22 1500 06/27/22 2300 06/27/22 2330 06/28/22 0000  BP: (!) 190/133 (!) 176/106 (!) 173/90 (!) 157/99   06/28/22 0030  BP: (!) 148/89   Cont amlodipine and  pRN hydralazine.   Acute kidney failure, unspecified ([place]) Patient presenting with acute kidney injury with a creatinine of 4.26. At attribute decreased p.o. intake BPH and ARB.  Lab Results  Component Value Date   CREATININE 4.26 (H) 06/27/2022   Avoid any nephrotoxic's contrast studies   DVT prophylaxis:  Heparin    Code Status:  Full Code    Family Communication:  NATURE, VOGELSANG (Spouse)  [phone] (Home Phone)   Disposition Plan:  Home    Consults called:  Urology Dr. Diamantina Providence  Admission status: Inpatient.    Unit/ Expected LOS: Med tele. / 2 days.    Para Skeans MD [facility]  6 PM- 2 AM. Please contact me via secure Chat 6 PM-2 AM. [phone] ( Pager ) To contact the [facility] Attending or Consulting provider [place] or covering provider during after hours [place], for this patient.   Check the care team in [facility] and look for a) attending/consulting [facility] provider listed and b) the [facility] team listed Log into [email or web address] and use [place] universal password to access. If you do not have the password, please contact the hospital operator. Locate the [facility] provider you are looking for under  [facility] and page to a number that you can be directly reached. If you still have difficulty reaching the provider, please page the [facility] (Director on Call) for the Hospitalists listed on amion for assistance. [email or web address] 06/28/2022, 2:13 AM

## 2022-06-27 NOTE — Telephone Encounter (Signed)
Spoke with patient and son and they don't want pt to go into renal failure. Per pt and son pt should be seen today after speaking with Dr. Lanora Manis. I advised son and pt that they need to let Dr. Lanora Manis know he can't be seen today and ask if he needs to go to the ER. Son agreed and will call Dr. Lillia Dallas office.

## 2022-06-27 NOTE — ED Notes (Signed)
Foley inserted, 1710mL urine out. Foley clamped halfway through to slow draining. Dr. Ellender Hose notified.

## 2022-06-27 NOTE — Hospital Course (Addendum)
Mr. Mccready was admitted to the hospital with the working diagnosis of AKI on CKD stage IV.   66 year old man with hypertension,elevated PSA, BPH previous biopsy in 2021-continue with urology every 6 months, hypertension who has been having issue with urination at home and her blood work done by PCP found to have renal failure creatinine 4.0 BUN 48, hemoglobin 10.6 g 06/26/22. and sent to the ED. he has not gone to her primary care doctor in several years. On his initial physical examination his blood pressure was 157/99, HR 85, RR18 and 02 saturation 100%, lungs with no wheezing or rales, heart with S1 and S2 present and rhythmic, abdomen with no distention, no lower extremity edema.   Na 141, K 5,0 cl 114 bicarbonate 20 glucose 102 bun 51 cr 4,26  Wbc 5,7 hgb 10,6 plt 192  Sars covid 19 negative   CT abdomen and pelvis with severe bilateral hydronephrosis to a massively distended urinary bladder with urinary bladder trabecular thickening and diverticula. Chronic outlet bladder obstruction. Prostatomegaly with median lobe hypertrophy, the prostate gland measuring 8,7 x 5.7 x 4,2 cm   In the ED Foley inserted 1750 mL urine removed, blood was pink with a small blood clots.  Renal function has bee improving.

## 2022-06-27 NOTE — Telephone Encounter (Signed)
Pt being seen at the ED today.

## 2022-06-27 NOTE — Telephone Encounter (Signed)
Pt calling after being seen by [facility] and asking to be seen today. Pt states his blood work is bad and need appt today . Per verbal from Dr. Diamantina Providence, if pt can not urinate he can see PA or just wait until appt on Tuesday. This has been ongoing for a few months.

## 2022-06-27 NOTE — ED Provider Triage Note (Signed)
Emergency Medicine Provider Triage Evaluation Note  Richie Bonanno , a 66 y.o. male  was evaluated in triage.  Pt complains of decreased kidney function.  Patient has prostate problems.  His PCP told him to come emergency department as his kidney functions had decreased to 16%.  States his blood pressure has been increasing over the past few days..  Review of Systems  Positive:  Negative:   Physical Exam  BP (!) 190/133   Pulse 66   Temp 98.5 F (36.9 C)   Resp 18   Wt 74.8 kg   SpO2 98%   BMI 23.68 kg/m  Gen:   Awake, no distress   Resp:  Normal effort  MSK:   Moves extremities without difficulty  Other:    Medical Decision Making  Medically screening exam initiated at 3:07 PM.  Appropriate orders placed.  Daimon Kean was informed that the remainder of the evaluation will be completed by another provider, this initial triage assessment does not replace that evaluation, and the importance of remaining in the ED until their evaluation is complete.     Versie Starks, PA-C 06/27/22 1525

## 2022-06-28 ENCOUNTER — Inpatient Hospital Stay: Payer: BC Managed Care – PPO

## 2022-06-28 DIAGNOSIS — R899 Unspecified abnormal finding in specimens from other organs, systems and tissues: Secondary | ICD-10-CM | POA: Diagnosis not present

## 2022-06-28 LAB — COMPREHENSIVE METABOLIC PANEL
ALT: 11 U/L (ref 0–44)
AST: 15 U/L (ref 15–41)
Albumin: 4.1 g/dL (ref 3.5–5.0)
Alkaline Phosphatase: 42 U/L (ref 38–126)
Anion gap: 7 (ref 5–15)
BUN: 45 mg/dL — ABNORMAL HIGH (ref 8–23)
CO2: 20 mmol/L — ABNORMAL LOW (ref 22–32)
Calcium: 9.2 mg/dL (ref 8.9–10.3)
Chloride: 116 mmol/L — ABNORMAL HIGH (ref 98–111)
Creatinine, Ser: 3.62 mg/dL — ABNORMAL HIGH (ref 0.61–1.24)
GFR, Estimated: 18 mL/min — ABNORMAL LOW (ref >60)
Glucose, Bld: 107 mg/dL — ABNORMAL HIGH (ref 70–99)
Potassium: 4.5 mmol/L (ref 3.5–5.1)
Sodium: 143 mmol/L (ref 135–145)
Total Bilirubin: 0.9 mg/dL (ref 0.3–1.2)
Total Protein: 7.6 g/dL (ref 6.5–8.1)

## 2022-06-28 LAB — HIV ANTIBODY (ROUTINE TESTING W REFLEX): HIV Screen 4th Generation wRfx: NONREACTIVE

## 2022-06-28 LAB — CBC
HCT: 31.6 % — ABNORMAL LOW (ref 39.0–52.0)
Hemoglobin: 10.6 g/dL — ABNORMAL LOW (ref 13.0–17.0)
MCH: 27.9 pg (ref 26.0–34.0)
MCHC: 33.5 g/dL (ref 30.0–36.0)
MCV: 83.2 fL (ref 80.0–100.0)
Platelets: 180 10*3/uL (ref 150–400)
RBC: 3.8 MIL/uL — ABNORMAL LOW (ref 4.22–5.81)
RDW: 11.9 % (ref 11.5–15.5)
WBC: 8.8 10*3/uL (ref 4.0–10.5)
nRBC: 0 % (ref 0.0–0.2)

## 2022-06-28 LAB — HEMOGLOBIN A1C
Hgb A1c MFr Bld: 5.7 % — ABNORMAL HIGH (ref 4.8–5.6)
Mean Plasma Glucose: 116.89 mg/dL

## 2022-06-28 MED ORDER — ONDANSETRON HCL 4 MG/2ML IJ SOLN
4.0000 mg | Freq: Four times a day (QID) | INTRAMUSCULAR | Status: DC | PRN
  Administered 2022-06-28 (×2): 4 mg via INTRAVENOUS
  Filled 2022-06-28 (×2): qty 2

## 2022-06-28 MED ORDER — HYDROCODONE-ACETAMINOPHEN 5-325 MG PO TABS
1.0000 | ORAL_TABLET | Freq: Four times a day (QID) | ORAL | Status: DC | PRN
  Administered 2022-06-28 – 2022-06-29 (×2): 1 via ORAL
  Filled 2022-06-28 (×2): qty 1

## 2022-06-28 NOTE — Progress Notes (Signed)
PROGRESS NOTE Mario Valdez  ZOX:[id number] DOB: 1955-10-05 DOA: 06/27/2022 PCP: Latanya Maudlin, NP   Brief Narrative/Hospital Course: 66 year old man with hypertension,elevated PSA, BPH previous biopsy in 2021-continue with urology every 6 months, hypertension who has been having issue with urination at home and her blood work done by PCP found to have renal failure creatinine 4.0 BUN 48, hemoglobin 10.6 g 06/26/22. and sent to the ED. he has not gone to her primary care doctor in several years. In the ED Foley inserted 1750 mL urine removed, blood was pink with a small blood clots Blood work-up with creatinine 4.2 bicarb 20 potassium 5.0, hemoglobin 10.6 g hemoglobin A1c 5.7.  Patient was admitted for further management     Subjective: seen thsi am in ED C/o headache and nausea Foley with urine and having some pinkish drain-adequate urine output 4.2 L  Assessment and Plan: Principal Problem:   Abnormal laboratory test Active Problems:   Acute kidney failure, unspecified ([place])   Hypertension  AKI on CKD  IV,previous creatinine 04/22/2029 with BUN 29/creatinine 2.3, prior to that in March 2023 creatinine 1.8.  Suspect AKI in CKD in the setting of obstructive nephropathy with BPH and also with hypertension also with BPH.  Nephrology consulted, monitor bicarb potassium.  Avoid nephrotoxic medication.  Creatinine slightly downtrending.  Continue to monitor. Recent Labs  Lab 06/27/22 1507 06/28/22 0753  BUN 51* 45*  CREATININE 4.26* 3.62*    BPH Obstructive nephropathy: Having good urine output after Foley insertion.  Maintain Foley.  Followed by urology Dr Diamantina Providence.  Continue Flomax, monitor urine output.  Hold off on heparin due to patient's hematuria add SCD instead .  He will need to have outpatient follow-up with urology.  Uncontrolled hypertension: BP 190/133 on presentation, on amlodipine 5 mg appears well controlled. Anemia likely of CKD, obtain anemia panel. Recent Labs  Lab  06/27/22 1507 06/28/22 0753  HGB 10.6* 10.6*  HCT 32.1* 31.6*    Prediabetes with hemoglobin A1c 5.7 Recent Labs  Lab 06/27/22 1529  HGBA1C 5.7*    Nausea: Symptomatic management  DVT prophylaxis: Place and maintain sequential compression device Start: 06/28/22 0909 Code Status:   Code Status: Full Code Family Communication: plan of care discussed with patient at bedside. Patient status is: Inpatient because of ongoing renal failure  Level of care: Telemetry Medical  Dispo: The patient is from: home            Anticipated disposition: home  Mobility Assessment (last 72 hours)     Mobility Assessment   No documentation.          Objective: Vitals last 24 hrs: Vitals:   06/28/22 0416 06/28/22 0512 06/28/22 0645 06/28/22 0900  BP: 110/67 118/81 132/81 138/89  Pulse: 67   86  Resp: 19   18  Temp: 97.9 F (36.6 C)   97.8 F (36.6 C)  TempSrc: Oral   Oral  SpO2: 100%   98%  Weight:      Height:       Weight change:   Physical Examination: General exam: alert awake, older than stated age HEENT:Oral mucosa moist, Ear/Nose WNL grossly Respiratory system: bilaterally clear BS, no use of accessory muscle Cardiovascular system: S1 & S2 +, No JVD. Gastrointestinal system: Abdomen soft,NT,ND, BS+ Nervous System:Alert, awake, moving extremities. Extremities: LE edema neg,distal peripheral pulses palpable.  Skin: No rashes,no icterus. MSK: Normal muscle bulk,tone, power  Medications reviewed:  Scheduled Meds:  amLODipine  5 mg Oral Daily   sodium  chloride flush  3 mL Intravenous Q12H   tamsulosin  0.4 mg Oral Daily   Continuous Infusions:  lactated ringers 75 mL/hr at 06/28/22 0235     Diet Order             Diet NPO time specified  Diet effective midnight                   Intake/Output Summary (Last 24 hours) at 06/28/2022 0930 Last data filed at 06/28/2022 2505 Gross per 24 hour  Intake 303.6 ml  Output 4135 ml  Net -3831.4 ml   Net IO  Since Admission: -3,831.4 mL [06/28/22 0930]  Wt Readings from Last 3 Encounters:  06/27/22 74.8 kg  01/08/22 74.8 kg  04/14/21 72.6 kg     Unresulted Labs (From admission, onward)     Start     Ordered   06/29/22 0500  Vitamin B12  (Anemia Panel (PNL))  Tomorrow morning,   R        06/28/22 0750   06/29/22 0500  Folate  (Anemia Panel (PNL))  Tomorrow morning,   R        06/28/22 0750   06/29/22 0500  Iron and TIBC  (Anemia Panel (PNL))  Tomorrow morning,   R        06/28/22 0750   06/29/22 0500  Ferritin  (Anemia Panel (PNL))  Tomorrow morning,   R        06/28/22 0750   06/29/22 0500  Reticulocytes  (Anemia Panel (PNL))  Tomorrow morning,   R        06/28/22 0750   06/29/22 3976  Basic metabolic panel  Daily at 5am,   R      06/28/22 0750   06/29/22 0500  CBC  Daily at 5am,   R      06/28/22 0750   06/28/22 0500  HIV Antibody (routine testing w rflx)  Once,   R        06/28/22 0500          Data Reviewed: I have personally reviewed following labs and imaging studies CBC: Recent Labs  Lab 06/27/22 1507 06/28/22 0753  WBC 5.7 8.8  NEUTROABS 4.1  --   HGB 10.6* 10.6*  HCT 32.1* 31.6*  MCV 84.7 83.2  PLT 192 734   Basic Metabolic Panel: Recent Labs  Lab 06/27/22 1507 06/28/22 0753  NA 141 143  K 5.0 4.5  CL 114* 116*  CO2 20* 20*  GLUCOSE 102* 107*  BUN 51* 45*  CREATININE 4.26* 3.62*  CALCIUM 9.3 9.2   GFR: Estimated Creatinine Clearance: 21 mL/min (A) (by C-G formula based on SCr of 3.62 mg/dL (H)). Liver Function Tests: Recent Labs  Lab 06/27/22 1507 06/28/22 0753  AST 18 15  ALT 11 11  ALKPHOS 45 42  BILITOT 0.7 0.9  PROT 8.2* 7.6  ALBUMIN 4.5 4.1  HbA1C: Recent Labs    06/27/22 1529  HGBA1C 5.7*    Recent Results (from the past 240 hour(s))  SARS Coronavirus 2 by RT PCR (hospital order, performed in [facility] hospital lab) *cepheid single result test* Anterior Nasal Swab     Status: None   Collection Time: 06/27/22  7:37 PM    Specimen: Anterior Nasal Swab  Result Value Ref Range Status   SARS Coronavirus 2 by RT PCR NEGATIVE NEGATIVE Final    Comment: (NOTE) SARS-CoV-2 target nucleic acids are NOT DETECTED.  The SARS-CoV-2 RNA is generally detectable  in upper and lower respiratory specimens during the acute phase of infection. The lowest concentration of SARS-CoV-2 viral copies this assay can detect is 250 copies / mL. A negative result does not preclude SARS-CoV-2 infection and should not be used as the sole basis for treatment or other patient management decisions.  A negative result may occur with improper specimen collection / handling, submission of specimen other than nasopharyngeal swab, presence of viral mutation(s) within the areas targeted by this assay, and inadequate number of viral copies (<250 copies / mL). A negative result must be combined with clinical observations, patient history, and epidemiological information.  Fact Sheet for Patients:   https://www.patel.info/  Fact Sheet for Healthcare Providers: https://hall.com/  This test is not yet approved or  cleared by the [place] FDA and has been authorized for detection and/or diagnosis of SARS-CoV-2 by FDA under an Emergency Use Authorization (EUA).  This EUA will remain in effect (meaning this test can be used) for the duration of the COVID-19 declaration under Section 564(b)(1) of the Act, 21 U.S.C. section 360bbb-3(b)(1), unless the authorization is terminated or revoked sooner.  Performed at [facility], [address] [place], [place] [address]     Antimicrobials: Anti-infectives (From admission, onward)    None      Culture/Microbiology No results found for: "SDES", "SPECREQUEST", "CULT", "REPTSTATUS"  Other culture-see note  Radiology Studies: No results found.   LOS: 1 day   Antonieta Pert, MD [facility]  06/28/2022, 9:30 AM

## 2022-06-28 NOTE — Assessment & Plan Note (Signed)
Patient presenting to [place] with an abnormal lab. Patient was told that his kidneys failure and to come to the hospital. Lab Results  Component Value Date   CREATININE 4.26 (H) 06/27/2022  . Attribute to Diovan related acute kidney injury along with BPH. Patient advised to refrain from using any NSAIDs and also Diovan. Advised about not using NSAIDs prevent further blood pressures reviewed years.

## 2022-06-28 NOTE — ED Notes (Signed)
Informed RN bed assigned 

## 2022-06-28 NOTE — Assessment & Plan Note (Signed)
Patient presenting with acute kidney injury with a creatinine of 4.26. At attribute decreased p.o. intake BPH and ARB.  Lab Results  Component Value Date   CREATININE 4.26 (H) 06/27/2022   Avoid any nephrotoxic's contrast studies

## 2022-06-28 NOTE — Progress Notes (Addendum)
Pt is vomitting a yellow green residue. Foley was in placed and urine color was pink in color with small blood clots. NP Randol Kern made aware and palced order. Will continue to monitor.  Update 0640 Pt refused heparin sub cutaneous injection but was educated about its importance. Will continued to monitor.

## 2022-06-28 NOTE — Assessment & Plan Note (Signed)
Vitals:   06/27/22 1500 06/27/22 2300 06/27/22 2330 06/28/22 0000  BP: (!) 190/133 (!) 176/106 (!) 173/90 (!) 157/99   06/28/22 0030  BP: (!) 148/89   Cont amlodipine and pRN hydralazine.

## 2022-06-28 NOTE — Progress Notes (Addendum)
[facility]  ROUNDING NOTE   Subjective:   Mario Valdez is a 66 year old male with past medical history of hypertension, chronic hydronephrosis, and BPH.  Patient presents to the emergency department at the advice of nephrology for abnormal labs.  Patient will be admitted for Abnormal laboratory test [R89.9]  Patient is known to our practice and is followed outpatient by Dr. Lanora Manis.  He was last seen in the office on 06/26/2022.  Patient is seen lying in bed.  Appears uncomfortable, restless, grimacing, holding his head.  Patient states he has a really bad headache, has progressively gotten worse throughout day.  Patient states he received a call from nephrologist about abnormal labs and requesting him to follow-up in emergency department.  Patient states his blood pressure has been elevated for the past 3 months.  He states he does follow with urology outpatient for chronic hydronephrosis.  Foley catheter placed in the ED with immediate urine output greater than 1 L.  Urine blood-tinged with clots.  Denies NSAID use.  Denies nausea.  Patient states he vomited yesterday, but denies previous vomiting.  Denies diarrhea.  Denies chest pain or shortness of breath, remains on room air.  Labs on ED arrival significant for serum bicarb 20, BUN 51, creatinine 4.26 with GFR 15, and hemoglobin 10.6.  CT abdomen pelvis from 06/18/2022 shows severe bilateral hydroureteronephrosis with a massively distended urinary bladder.  Creatinine outpatient elevated in August to 2.3 with GFR 29.  Baseline appears to be from March, creatinine 1.8 with GFR 38.  Labs did improve slightly today with Foley catheter placement, creatinine 3.62 with GFR 18.  We have been consulted to manage acute kidney injury.  Objective:  Vital signs in last 24 hours:  Temp:  [97.8 F (36.6 C)-98.5 F (36.9 C)] 97.8 F (36.6 C) (10/13 0900) Pulse Rate:  [66-91] 86 (10/13 0900) Resp:  [18-19] 18 (10/13 0900) BP:  (110-190)/(67-133) 138/89 (10/13 0900) SpO2:  [98 %-100 %] 98 % (10/13 0900) Weight:  [74.8 kg] 74.8 kg (10/12 1741)  Weight change:  Filed Weights   06/27/22 1501 06/27/22 1741  Weight: 74.8 kg 74.8 kg    Intake/Output: I/O last 3 completed shifts: In: 303.6 [I.V.:303.6] Out: 4235 [Urine:4135]   Intake/Output this shift:  No intake/output data recorded.  Physical Exam: General: Restless, grimacing, ill-appearing  Head: Normocephalic, atraumatic. Moist oral mucosal membranes  Eyes: Anicteric  Lungs:  Clear to auscultation, normal effort  Heart: Regular rate and rhythm  Abdomen:  Soft, nontender  Extremities: No peripheral edema.  Neurologic: Nonfocal, moving all four extremities  Skin: No lesions  Access: None  GU-Foley catheter-pink-tinged urine  Basic Metabolic Panel: Recent Labs  Lab 06/27/22 1507 06/28/22 0753  NA 141 143  K 5.0 4.5  CL 114* 116*  CO2 20* 20*  GLUCOSE 102* 107*  BUN 51* 45*  CREATININE 4.26* 3.62*  CALCIUM 9.3 9.2    Liver Function Tests: Recent Labs  Lab 06/27/22 1507 06/28/22 0753  AST 18 15  ALT 11 11  ALKPHOS 45 42  BILITOT 0.7 0.9  PROT 8.2* 7.6  ALBUMIN 4.5 4.1   No results for input(s): "LIPASE", "AMYLASE" in the last 168 hours. No results for input(s): "AMMONIA" in the last 168 hours.  CBC: Recent Labs  Lab 06/27/22 1507 06/28/22 0753  WBC 5.7 8.8  NEUTROABS 4.1  --   HGB 10.6* 10.6*  HCT 32.1* 31.6*  MCV 84.7 83.2  PLT 192 180    Cardiac Enzymes: No results  for input(s): "CKTOTAL", "CKMB", "CKMBINDEX", "TROPONINI" in the last 168 hours.  BNP: Invalid input(s): "POCBNP"  CBG: No results for input(s): "GLUCAP" in the last 168 hours.  Microbiology: Results for orders placed or performed during the hospital encounter of 06/27/22  SARS Coronavirus 2 by RT PCR (hospital order, performed in [facility] hospital lab) *cepheid single result test* Anterior Nasal Swab     Status: None   Collection Time: 06/27/22   7:37 PM   Specimen: Anterior Nasal Swab  Result Value Ref Range Status   SARS Coronavirus 2 by RT PCR NEGATIVE NEGATIVE Final    Comment: (NOTE) SARS-CoV-2 target nucleic acids are NOT DETECTED.  The SARS-CoV-2 RNA is generally detectable in upper and lower respiratory specimens during the acute phase of infection. The lowest concentration of SARS-CoV-2 viral copies this assay can detect is 250 copies / mL. A negative result does not preclude SARS-CoV-2 infection and should not be used as the sole basis for treatment or other patient management decisions.  A negative result may occur with improper specimen collection / handling, submission of specimen other than nasopharyngeal swab, presence of viral mutation(s) within the areas targeted by this assay, and inadequate number of viral copies (<250 copies / mL). A negative result must be combined with clinical observations, patient history, and epidemiological information.  Fact Sheet for Patients:   https://www.patel.info/  Fact Sheet for Healthcare Providers: https://hall.com/  This test is not yet approved or  cleared by the [place] FDA and has been authorized for detection and/or diagnosis of SARS-CoV-2 by FDA under an Emergency Use Authorization (EUA).  This EUA will remain in effect (meaning this test can be used) for the duration of the COVID-19 declaration under Section 564(b)(1) of the Act, 21 U.S.C. section 360bbb-3(b)(1), unless the authorization is terminated or revoked sooner.  Performed at [facility], [address] [place], [place] [address]     Coagulation Studies: No results for input(s): "LABPROT", "INR" in the last 72 hours.  Urinalysis: No results for input(s): "COLORURINE", "LABSPEC", "PHURINE", "GLUCOSEU", "HGBUR", "BILIRUBINUR", "KETONESUR", "PROTEINUR", "UROBILINOGEN", "NITRITE", "LEUKOCYTESUR" in the last 72 hours.  Invalid input(s):  "APPERANCEUR"    Imaging: No results found.   Medications:    lactated ringers 75 mL/hr at 06/28/22 0235    amLODipine  5 mg Oral Daily   sodium chloride flush  3 mL Intravenous Q12H   tamsulosin  0.4 mg Oral Daily   acetaminophen **OR** acetaminophen, albuterol, HYDROcodone-acetaminophen, morphine injection, ondansetron (ZOFRAN) IV  Assessment/ Plan:  Mr. Mario Valdez is a 66 y.o.  male with past medical history of hypertension, chronic hydronephrosis, and BPH.  Patient presents to the emergency department at the advice of nephrology for abnormal labs.  Patient will be admitted for Abnormal laboratory test [R89.9]   Acute kidney injury on chronic kidney disease stage IIIb.  Baseline creatinine appears to be 1.8 with GFR 38 on 12/05/2021.  CT abdomen pelvis completed on 06/18/2022 shows severe bilateral hydroureteronephrosis with a massively distended urinary bladder.  Foley catheter placed in the emergency department, pink-tinged urine.  Urology consulted to evaluate BPH and hydroureteronephrosis.  Labs did improve with IV fluids and Foley catheter.  Agree with IV fluids.  No acute need for dialysis at this time.  Adequate urine output noted.  Expect renal function to improve with decompression.  2. Anemia of chronic kidney disease Lab Results  Component Value Date   HGB 10.6 (L) 06/28/2022    Hemoglobin within desired target.  We  will continue to monitor  3. Secondary Hyperparathyroidism: with outpatient labs: PTH 96, phosphorus 4.2, calcium 9.1 on 06/26/22.   Lab Results  Component Value Date   CALCIUM 9.2 06/28/2022    We will continue to monitor bone minerals during this admission.  4.  Hypertension with chronic kidney disease.  Home regimen includes amlodipine and valsartan.  Valsartan currently held in setting of kidney injury.  Blood pressure currently acceptable for this patient.   LOS: 1 Mario Valdez 10/13/202310:04 AM

## 2022-06-29 DIAGNOSIS — R899 Unspecified abnormal finding in specimens from other organs, systems and tissues: Secondary | ICD-10-CM | POA: Diagnosis not present

## 2022-06-29 LAB — FOLATE: Folate: 10.9 ng/mL (ref >5.9)

## 2022-06-29 LAB — CBC
HCT: 30.2 % — ABNORMAL LOW (ref 39.0–52.0)
Hemoglobin: 10.3 g/dL — ABNORMAL LOW (ref 13.0–17.0)
MCH: 28.2 pg (ref 26.0–34.0)
MCHC: 34.1 g/dL (ref 30.0–36.0)
MCV: 82.7 fL (ref 80.0–100.0)
Platelets: 190 10*3/uL (ref 150–400)
RBC: 3.65 MIL/uL — ABNORMAL LOW (ref 4.22–5.81)
RDW: 11.9 % (ref 11.5–15.5)
WBC: 7.1 10*3/uL (ref 4.0–10.5)
nRBC: 0 % (ref 0.0–0.2)

## 2022-06-29 LAB — VITAMIN B12: Vitamin B-12: 377 pg/mL (ref 180–914)

## 2022-06-29 LAB — BASIC METABOLIC PANEL
Anion gap: 5 (ref 5–15)
BUN: 41 mg/dL — ABNORMAL HIGH (ref 8–23)
CO2: 24 mmol/L (ref 22–32)
Calcium: 8.8 mg/dL — ABNORMAL LOW (ref 8.9–10.3)
Chloride: 112 mmol/L — ABNORMAL HIGH (ref 98–111)
Creatinine, Ser: 3.49 mg/dL — ABNORMAL HIGH (ref 0.61–1.24)
GFR, Estimated: 19 mL/min — ABNORMAL LOW (ref >60)
Glucose, Bld: 99 mg/dL (ref 70–99)
Potassium: 4.1 mmol/L (ref 3.5–5.1)
Sodium: 141 mmol/L (ref 135–145)

## 2022-06-29 LAB — RETICULOCYTES
Immature Retic Fract: 4.2 % (ref 2.3–15.9)
RBC.: 3.6 MIL/uL — ABNORMAL LOW (ref 4.22–5.81)
Retic Count, Absolute: 27 10*3/uL (ref 19.0–186.0)
Retic Ct Pct: 0.8 % (ref 0.4–3.1)

## 2022-06-29 LAB — IRON AND TIBC
Iron: 90 ug/dL (ref 45–182)
Saturation Ratios: 36 % (ref 17.9–39.5)
TIBC: 251 ug/dL (ref 250–450)
UIBC: 161 ug/dL

## 2022-06-29 LAB — FERRITIN: Ferritin: 265 ng/mL (ref 24–336)

## 2022-06-29 MED ORDER — CHLORHEXIDINE GLUCONATE CLOTH 2 % EX PADS
6.0000 | MEDICATED_PAD | Freq: Every day | CUTANEOUS | Status: DC
  Administered 2022-06-29 – 2022-06-30 (×2): 6 via TOPICAL

## 2022-06-29 NOTE — Progress Notes (Signed)
PROGRESS NOTE Mario Valdez  UVO:[id number] DOB: [birth year] DOA: 06/27/2022 PCP: Latanya Maudlin, NP   Brief Narrative/Hospital Course: 66 year old man with hypertension,elevated PSA, BPH previous biopsy in 2021-continue with urology every 6 months, hypertension who has been having issue with urination at home and her blood work done by PCP found to have renal failure creatinine 4.0 BUN 48, hemoglobin 10.6 g 06/26/22. and sent to the ED. he has not gone to her primary care doctor in several years. In the ED Foley inserted 1750 mL urine removed, blood was pink with a small blood clots Blood work-up with creatinine 4.2 bicarb 20 potassium 5.0, hemoglobin 10.6 g hemoglobin A1c 5.7.  Patient was admitted for further management-nephrology consulted.     Subjective: Seen and examined this morning, resting comfortably no new complaints, Afebrile overnight, SBP 120s to 140s Foley with urine output 4150 ML> clear urine,creat holding 3.4  Assessment and Plan: Principal Problem:   Abnormal laboratory test Active Problems:   Acute kidney failure, unspecified ([place])   Hypertension  AKI on CKD  IV,previous creatinine 04/22/2029 with BUN 29/creatinine 2.3, prior to that in March 2023 creatinine 1.8.  Suspect AKI in CKD in the setting of obstructive nephropathy with BPH and also with hypertension.  Appreciate nephrology input on board-discussed, continue gentle IV fluid hydration, hematuria has resolved, creatinine slowly improving. cont Foley  and cont monitoring of renal function and urine output as per nephrology. Recent Labs  Lab 06/27/22 1507 06/28/22 0753 06/29/22 0426  BUN 51* 45* 41*  CREATININE 4.26* 3.62* 3.49*    BPH-CT w/ prostatomegaly 8.7x 5.7X 4.2 cm on 10/2 Obstructive nephropathy Severe bilateral hydroureteronephrosis with a massively distended bladder on CT 10/3: Having good urine output after Foley insertion In ED>  Maintain Foley, he is followed by urology Dr Diamantina Providence as OP and  will need to cont to do that. Continue Flomax, monitor urine output.  Mild hematuria in the ED yesterday continue to hold off heparin.   Uncontrolled hypertension: BP 190/133 on presentation, started on amlodipine 5 mg, BP is controlled   Anemia likely of CKD, stable anemia panel and H bas below. Recent Labs  Lab 06/27/22 1507 06/28/22 0753 06/29/22 0426  HGB 10.6* 10.6* 10.3*  HCT 32.1* 31.6* 30.2*   Recent Labs    06/27/22 1507 06/28/22 0753 06/29/22 0426  HGB 10.6* 10.6* 10.3*  MCV 84.7 83.2 82.7  FOLATE  --   --  10.9  FERRITIN  --   --  265  TIBC  --   --  251  IRON  --   --  90  RETICCTPCT  --   --  0.8     Prediabetes with hemoglobin A1c 5.7.  Dietary education counseling Recent Labs  Lab 06/27/22 1529  HGBA1C 5.7*    Nausea: Symptomatic management  DVT prophylaxis: Place and maintain sequential compression device Start: 06/28/22 0909 Code Status:   Code Status: Full Code Family Communication: plan of care discussed with patient at bedside.  Patient status is: Inpatient because of ongoing renal failure Level of care: Telemetry Medical  Dispo: The patient is from: home> current activity level at 6 walks independently            Anticipated disposition: home once cleared by nephrology Objective: Vitals last 24 hrs: Vitals:   06/28/22 2021 06/28/22 2354 06/29/22 0451 06/29/22 0452  BP: (!) 146/88 (!) 149/86 134/81   Pulse: 69 76 75   Resp: 18 16 18    Temp: 98 F (36.7  C) 98.3 F (36.8 C) 98.4 F (36.9 C)   TempSrc:      SpO2: 100% 100% 100%   Weight:    71.7 kg  Height:       Weight change: -3.143 kg  Physical Examination: General exam: alert awake, oriented, pleasant HEENT:Oral mucosa moist, Ear/Nose WNL grossly Respiratory system: Bilaterally clear BS, no use of accessory muscle Cardiovascular system: S1 & S2 +, No JVD. Gastrointestinal system: Abdomen soft,NT,ND, BS+ Nervous System: Alert, awake, moving extremities, he follows  commands. Extremities: LE edema neg,distal peripheral pulses palpable.  Skin: No rashes,no icterus. MSK: Normal muscle bulk,tone, power  Foley+  Medications reviewed:  Scheduled Meds:  amLODipine  5 mg Oral Daily   Chlorhexidine Gluconate Cloth  6 each Topical Daily   sodium chloride flush  3 mL Intravenous Q12H   tamsulosin  0.4 mg Oral Daily   Continuous Infusions:  lactated ringers 75 mL/hr at 06/29/22 0324     Diet Order             Diet regular Room service appropriate? Yes; Fluid consistency: Thin  Diet effective now                   Intake/Output Summary (Last 24 hours) at 06/29/2022 0756 Last data filed at 06/29/2022 0451 Gross per 24 hour  Intake 652.16 ml  Output 4150 ml  Net -3497.84 ml   Net IO Since Admission: -7,329.24 mL [06/29/22 0756]  Wt Readings from Last 3 Encounters:  06/29/22 71.7 kg  01/08/22 74.8 kg  04/14/21 72.6 kg     Unresulted Labs (From admission, onward)     Start     Ordered   06/29/22 0500  Vitamin B12  (Anemia Panel (PNL))  Tomorrow morning,   R        06/28/22 0750   06/29/22 9449  Basic metabolic panel  Daily at 5am,   R      06/28/22 0750   06/29/22 0500  CBC  Daily at 5am,   R      06/28/22 0750          Data Reviewed: I have personally reviewed following labs and imaging studies CBC: Recent Labs  Lab 06/27/22 1507 06/28/22 0753 06/29/22 0426  WBC 5.7 8.8 7.1  NEUTROABS 4.1  --   --   HGB 10.6* 10.6* 10.3*  HCT 32.1* 31.6* 30.2*  MCV 84.7 83.2 82.7  PLT 192 180 675   Basic Metabolic Panel: Recent Labs  Lab 06/27/22 1507 06/28/22 0753 06/29/22 0426  NA 141 143 141  K 5.0 4.5 4.1  CL 114* 116* 112*  CO2 20* 20* 24  GLUCOSE 102* 107* 99  BUN 51* 45* 41*  CREATININE 4.26* 3.62* 3.49*  CALCIUM 9.3 9.2 8.8*   GFR: Estimated Creatinine Clearance: 21.4 mL/min (A) (by C-G formula based on SCr of 3.49 mg/dL (H)). Liver Function Tests: Recent Labs  Lab 06/27/22 1507 06/28/22 0753  AST 18 15  ALT  11 11  ALKPHOS 45 42  BILITOT 0.7 0.9  PROT 8.2* 7.6  ALBUMIN 4.5 4.1  HbA1C: Recent Labs    06/27/22 1529  HGBA1C 5.7*    Antimicrobials: Anti-infectives (From admission, onward)    None      Culture/Microbiology No results found for: "SDES", "SPECREQUEST", "CULT", "REPTSTATUS"  Other culture-see note  Radiology Studies: No results found.   LOS: 2 days   Antonieta Pert, MD [facility]  06/29/2022, 7:56 AM

## 2022-06-29 NOTE — Plan of Care (Signed)
  Problem: Education: Goal: Knowledge of General Education information will improve Description: Including pain rating scale, medication(s)/side effects and non-pharmacologic comfort measures Outcome: Progressing   Problem: Health Behavior/Discharge Planning: Goal: Ability to manage health-related needs will improve Outcome: Progressing   Problem: Clinical Measurements: Goal: Ability to maintain clinical measurements within normal limits will improve Outcome: Progressing Goal: Will remain free from infection Outcome: Progressing Goal: Diagnostic test results will improve Outcome: Progressing Goal: Respiratory complications will improve Outcome: Progressing Goal: Cardiovascular complication will be avoided Outcome: Progressing   Problem: Activity: Goal: Risk for activity intolerance will decrease Outcome: Progressing  Pt ambulates independently in his room Problem: Nutrition: Goal: Adequate nutrition will be maintained Outcome: Progressing   Problem: Coping: Goal: Level of anxiety will decrease Outcome: Progressing   Problem: Elimination: Goal: Will not experience complications related to bowel motility Outcome: Progressing Goal: Will not experience complications related to urinary retention Outcome: Progressing  Pt with foley catheter for bladder outlet obstruction in place; clear yellow urine output Problem: Pain Managment: Goal: General experience of comfort will improve Outcome: Progressing  PRN pain medication for c/o headache with relief this shift Problem: Safety: Goal: Ability to remain free from injury will improve Outcome: Progressing   Problem: Skin Integrity: Goal: Risk for impaired skin integrity will decrease Outcome: Progressing

## 2022-06-29 NOTE — Progress Notes (Signed)
[facility]  ROUNDING NOTE   Subjective:   Mario Valdez is a 66 year old male with past medical history of hypertension, chronic hydronephrosis, and BPH.  Patient presents to the emergency department at the advice of nephrology for abnormal labs.  Patient will be admitted for Abnormal laboratory test [R89.9] AKI (acute kidney injury) ([place]) [N17.9] Bladder outlet obstruction [N32.0] Benign prostatic hyperplasia with urinary frequency [N40.1, R35.0]  Patient is known to our practice and is followed outpatient by Dr. Lanora Manis.  He was last seen in the office on 06/26/2022.  Patient is seen lying in bed.  Appears uncomfortable, restless, grimacing, holding his head.  Patient states he has a really bad headache, has progressively gotten worse throughout day.  Patient states he received a call from nephrologist about abnormal labs and requesting him to follow-up in emergency department.  Patient states his blood pressure has been elevated for the past 3 months.  He states he does follow with urology outpatient for chronic hydronephrosis.  Foley catheter placed in the ED with immediate urine output greater than 1 L.  Urine blood-tinged with clots.  Denies NSAID use.  Denies nausea.  Patient states he vomited yesterday, but denies previous vomiting.  Denies diarrhea.  Denies chest pain or shortness of breath, remains on room air.  Labs on ED arrival significant for serum bicarb 20, BUN 51, creatinine 4.26 with GFR 15, and hemoglobin 10.6.  CT abdomen pelvis from 06/18/2022 shows severe bilateral hydroureteronephrosis with a massively distended urinary bladder.  Creatinine outpatient elevated in August to 2.3 with GFR 29.  Baseline appears to be from March, creatinine 1.8 with GFR 38.  Labs did improve slightly today with Foley catheter placement, creatinine 3.62 with GFR 18.  We have been consulted to manage acute kidney injury.  Patient's main complaint in today visit was that he was wondering  about his discharge to home. I then discussed the patient about his kidney related issues.  Objective:  Vital signs in last 24 hours:  Temp:  [98 F (36.7 C)-98.4 F (36.9 C)] 98 F (36.7 C) (10/14 1234) Pulse Rate:  [69-83] 75 (10/14 1234) Resp:  [16-18] 17 (10/14 1234) BP: (132-149)/(81-101) 134/101 (10/14 1234) SpO2:  [99 %-100 %] 100 % (10/14 1234) Weight:  [71.7 kg] 71.7 kg (10/14 0452)  Weight change: -3.143 kg Filed Weights   06/27/22 1501 06/27/22 1741 06/29/22 0452  Weight: 74.8 kg 74.8 kg 71.7 kg    Intake/Output: I/O last 3 completed shifts: In: 955.8 [I.V.:955.8] Out: 8285 [Urine:8285]   Intake/Output this shift:  Total I/O In: 2078.7 [P.O.:680; I.V.:1398.7] Out: -   Physical Exam: General: Restless, grimacing, ill-appearing  Head: Normocephalic, atraumatic. Moist oral mucosal membranes  Eyes: Anicteric  Lungs:  Clear to auscultation, normal effort  Heart: Regular rate and rhythm  Abdomen:  Soft, nontender  Extremities: No peripheral edema.  Neurologic: Nonfocal, moving all four extremities  Skin: No lesions  Access: None  GU-Foley catheter-pink-tinged urine  Basic Metabolic Panel: Recent Labs  Lab 06/27/22 1507 06/28/22 0753 06/29/22 0426  NA 141 143 141  K 5.0 4.5 4.1  CL 114* 116* 112*  CO2 20* 20* 24  GLUCOSE 102* 107* 99  BUN 51* 45* 41*  CREATININE 4.26* 3.62* 3.49*  CALCIUM 9.3 9.2 8.8*    Liver Function Tests: Recent Labs  Lab 06/27/22 1507 06/28/22 0753  AST 18 15  ALT 11 11  ALKPHOS 45 42  BILITOT 0.7 0.9  PROT 8.2* 7.6  ALBUMIN 4.5 4.1  No results for input(s): "LIPASE", "AMYLASE" in the last 168 hours. No results for input(s): "AMMONIA" in the last 168 hours.  CBC: Recent Labs  Lab 06/27/22 1507 06/28/22 0753 06/29/22 0426  WBC 5.7 8.8 7.1  NEUTROABS 4.1  --   --   HGB 10.6* 10.6* 10.3*  HCT 32.1* 31.6* 30.2*  MCV 84.7 83.2 82.7  PLT 192 180 190    Cardiac Enzymes: No results for input(s): "CKTOTAL",  "CKMB", "CKMBINDEX", "TROPONINI" in the last 168 hours.  BNP: Invalid input(s): "POCBNP"  CBG: No results for input(s): "GLUCAP" in the last 168 hours.  Microbiology: Results for orders placed or performed during the hospital encounter of 06/27/22  SARS Coronavirus 2 by RT PCR (hospital order, performed in [facility] hospital lab) *cepheid single result test* Anterior Nasal Swab     Status: None   Collection Time: 06/27/22  7:37 PM   Specimen: Anterior Nasal Swab  Result Value Ref Range Status   SARS Coronavirus 2 by RT PCR NEGATIVE NEGATIVE Final    Comment: (NOTE) SARS-CoV-2 target nucleic acids are NOT DETECTED.  The SARS-CoV-2 RNA is generally detectable in upper and lower respiratory specimens during the acute phase of infection. The lowest concentration of SARS-CoV-2 viral copies this assay can detect is 250 copies / mL. A negative result does not preclude SARS-CoV-2 infection and should not be used as the sole basis for treatment or other patient management decisions.  A negative result may occur with improper specimen collection / handling, submission of specimen other than nasopharyngeal swab, presence of viral mutation(s) within the areas targeted by this assay, and inadequate number of viral copies (<250 copies / mL). A negative result must be combined with clinical observations, patient history, and epidemiological information.  Fact Sheet for Patients:   https://www.patel.info/  Fact Sheet for Healthcare Providers: https://hall.com/  This test is not yet approved or  cleared by the [place] FDA and has been authorized for detection and/or diagnosis of SARS-CoV-2 by FDA under an Emergency Use Authorization (EUA).  This EUA will remain in effect (meaning this test can be used) for the duration of the COVID-19 declaration under Section 564(b)(1) of the Act, 21 U.S.C. section 360bbb-3(b)(1), unless the authorization is  terminated or revoked sooner.  Performed at [facility], [address] [place], [place] [address]     Coagulation Studies: No results for input(s): "LABPROT", "INR" in the last 72 hours.  Urinalysis: No results for input(s): "COLORURINE", "LABSPEC", "PHURINE", "GLUCOSEU", "HGBUR", "BILIRUBINUR", "KETONESUR", "PROTEINUR", "UROBILINOGEN", "NITRITE", "LEUKOCYTESUR" in the last 72 hours.  Invalid input(s): "APPERANCEUR"    Imaging: No results found.   Medications:    lactated ringers 75 mL/hr at 06/29/22 0324    amLODipine  5 mg Oral Daily   Chlorhexidine Gluconate Cloth  6 each Topical Daily   sodium chloride flush  3 mL Intravenous Q12H   tamsulosin  0.4 mg Oral Daily   acetaminophen **OR** acetaminophen, albuterol, HYDROcodone-acetaminophen, morphine injection, ondansetron (ZOFRAN) IV  Assessment/ Plan:  Mr. Mario Valdez is a 66 y.o.  male with past medical history of hypertension, chronic hydronephrosis, and BPH.  Patient presents to the emergency department at the advice of nephrology for abnormal labs.  Patient will be admitted for Abnormal laboratory test [R89.9] AKI (acute kidney injury) ([place]) [N17.9] Bladder outlet obstruction [N32.0] Benign prostatic hyperplasia with urinary frequency [N40.1, R35.0]   Acute kidney injury on chronic kidney disease stage IIIb.  Baseline creatinine appears to be 1.8 with GFR 38  on 12/05/2021.  CT abdomen pelvis completed on 06/18/2022 shows severe bilateral hydroureteronephrosis with a massively distended urinary bladder.  Foley catheter placed in the emergency department, pink-tinged urine.  Urology consulted to evaluate BPH and hydroureteronephrosis.  Labs did improve with IV fluids and Foley catheter.  Agree with IV fluids.  No acute need for dialysis at this time.  Adequate urine output noted. Patient creatinine is improving.       Patient be creatinine was around 4.3 it has now come down to 3.5  2. Anemia of chronic kidney  disease Lab Results  Component Value Date   HGB 10.3 (L) 06/29/2022    Hemoglobin within desired target.  We will continue to monitor  3. Secondary Hyperparathyroidism: with outpatient labs: PTH 96, phosphorus 4.2, calcium 9.1 on 06/26/22.   Lab Results  Component Value Date   CALCIUM 8.8 (L) 06/29/2022    We will continue to monitor bone mineral disease labs during this admission.  4.  Hypertension with chronic kidney disease.  Home regimen includes amlodipine and valsartan.  Valsartan currently held in setting of kidney injury.  Blood pressure currently acceptable for this patient.  5. Bilateral hydroureteronephrosis  patient does have an outpatient urology appointment this coming Tuesday   6. Acute metabolic acidosis Patient bicarb level is low but stable we will hold off on starting p.o. bicarb for now   LOS: 2 Mario Valdez s [facility] 10/14/20233:34 PM

## 2022-06-29 NOTE — Plan of Care (Signed)
Reviewed care and basic function of foley and drainage bag

## 2022-06-30 DIAGNOSIS — R7303 Prediabetes: Secondary | ICD-10-CM | POA: Diagnosis not present

## 2022-06-30 DIAGNOSIS — N189 Chronic kidney disease, unspecified: Secondary | ICD-10-CM

## 2022-06-30 DIAGNOSIS — N401 Enlarged prostate with lower urinary tract symptoms: Secondary | ICD-10-CM | POA: Diagnosis not present

## 2022-06-30 DIAGNOSIS — I10 Essential (primary) hypertension: Secondary | ICD-10-CM | POA: Diagnosis not present

## 2022-06-30 DIAGNOSIS — N179 Acute kidney failure, unspecified: Secondary | ICD-10-CM

## 2022-06-30 DIAGNOSIS — N138 Other obstructive and reflux uropathy: Secondary | ICD-10-CM

## 2022-06-30 LAB — BASIC METABOLIC PANEL
Anion gap: 8 (ref 5–15)
BUN: 41 mg/dL — ABNORMAL HIGH (ref 8–23)
CO2: 23 mmol/L (ref 22–32)
Calcium: 8.8 mg/dL — ABNORMAL LOW (ref 8.9–10.3)
Chloride: 108 mmol/L (ref 98–111)
Creatinine, Ser: 3.27 mg/dL — ABNORMAL HIGH (ref 0.61–1.24)
GFR, Estimated: 20 mL/min — ABNORMAL LOW (ref >60)
Glucose, Bld: 100 mg/dL — ABNORMAL HIGH (ref 70–99)
Potassium: 4 mmol/L (ref 3.5–5.1)
Sodium: 139 mmol/L (ref 135–145)

## 2022-06-30 LAB — CBC
HCT: 32.4 % — ABNORMAL LOW (ref 39.0–52.0)
Hemoglobin: 10.7 g/dL — ABNORMAL LOW (ref 13.0–17.0)
MCH: 27.6 pg (ref 26.0–34.0)
MCHC: 33 g/dL (ref 30.0–36.0)
MCV: 83.5 fL (ref 80.0–100.0)
Platelets: 191 10*3/uL (ref 150–400)
RBC: 3.88 MIL/uL — ABNORMAL LOW (ref 4.22–5.81)
RDW: 11.9 % (ref 11.5–15.5)
WBC: 7 10*3/uL (ref 4.0–10.5)
nRBC: 0 % (ref 0.0–0.2)

## 2022-06-30 MED ORDER — TAMSULOSIN HCL 0.4 MG PO CAPS: 0.4000 mg | ORAL_CAPSULE | Freq: Every day | ORAL | 0 refills | Status: DC

## 2022-06-30 MED ORDER — ACETAMINOPHEN 325 MG PO TABS: 650.0000 mg | ORAL_TABLET | Freq: Four times a day (QID) | ORAL | Status: DC | PRN

## 2022-06-30 MED ORDER — AMLODIPINE BESYLATE 5 MG PO TABS: 5.0000 mg | ORAL_TABLET | Freq: Every day | ORAL | 0 refills | Status: DC

## 2022-06-30 NOTE — Assessment & Plan Note (Signed)
Patient had uncontrolled hypertension, that improved with post obstructive diuresis and amlodipine.  Patient will continue to hold on valsartan until renal function back to baseline.  Continue outpatient blood pressure monitoring.

## 2022-06-30 NOTE — Plan of Care (Signed)

## 2022-06-30 NOTE — Discharge Instructions (Signed)
Foley catheter home care with leg bag; record output for future MD appointment

## 2022-06-30 NOTE — Progress Notes (Signed)
MD order received in [facility] to discharge pt home today with the foley catheter to a leg bag; education with return demonstration performed with pt and his spouse; regarding changing leg bag, if needed; and opening and closing leg bag; recording output; verbally reviewed AVS with pt; Rxs escribed to the [facility] in [place], [place] for Norvasc and Flomax; no questions voiced by the pt at this time; pt discharged via wheelchair to the [facility] entrance

## 2022-06-30 NOTE — Assessment & Plan Note (Addendum)
CKD stage 3b.  Obstructive uropathy due to enlarge prostate.   Patient had Foley cathter inserted with good toleration, his renal function has improved.  He had post obstructive polyuria but able to compensate with oral intake. Patient will be discharge with Foley catheter and follow up with urology as outpatient.   Anemia of chronic renal disease, hgb has been stable at 10,7, plan to follow up as outpatient.  Metabolic bone disease with PTH 96, O 4,2 and Ca 9,1   At the time of his discharge his renal function had a serum cr of  3,27 with K at 4,0 and serum bicarbonate at 23. Plan to follow up renal function as outpatient.

## 2022-06-30 NOTE — Plan of Care (Signed)
  Problem: Elimination: Goal: Will not experience complications related to urinary retention Outcome: Progressing   Problem: Pain Managment: Goal: General experience of comfort will improve Outcome: Progressing   

## 2022-06-30 NOTE — Progress Notes (Signed)
[facility]  ROUNDING NOTE   Subjective:   Mario Valdez is a 66 year old male with past medical history of hypertension, chronic hydronephrosis, and BPH.  Patient presents to the emergency department at the advice of nephrology for abnormal labs.  Patient will be admitted for Abnormal laboratory test [R89.9] AKI (acute kidney injury) ([place]) [N17.9] Bladder outlet obstruction [N32.0] Benign prostatic hyperplasia with urinary frequency [N40.1, R35.0]  Patient is known to our practice and is followed outpatient by Dr. Lanora Manis.  He was last seen in the office on 06/26/2022.  Patient is seen lying in bed.  Appears uncomfortable, restless, grimacing, holding his head.  Patient states he has a really bad headache, has progressively gotten worse throughout day.  Patient states he received a call from nephrologist about abnormal labs and requesting him to follow-up in emergency department.  Patient states his blood pressure has been elevated for the past 3 months.  He states he does follow with urology outpatient for chronic hydronephrosis.  Foley catheter placed in the ED with immediate urine output greater than 1 L.  Urine blood-tinged with clots.  Denies NSAID use.  Denies nausea.  Patient states he vomited yesterday, but denies previous vomiting.  Denies diarrhea.  Denies chest pain or shortness of breath, remains on room air.  Labs on ED arrival significant for serum bicarb 20, BUN 51, creatinine 4.26 with GFR 15, and hemoglobin 10.6.  CT abdomen pelvis from 06/18/2022 shows severe bilateral hydroureteronephrosis with a massively distended urinary bladder.  Creatinine outpatient elevated in August to 2.3 with GFR 29.  Baseline appears to be from March, creatinine 1.8 with GFR 38.  Labs did improve slightly today with Foley catheter placement, creatinine 3.62 with GFR 18.  We have been consulted to manage acute kidney injury.  Patient's main complaint in today visit was that he was wondering  about his discharge to home. I then discussed the patient about his kidney related issues.  Objective:  Vital signs in last 24 hours:  Temp:  [97.8 F (36.6 C)-98.9 F (37.2 C)] 97.9 F (36.6 C) (10/15 0741) Pulse Rate:  [73-100] 73 (10/15 0741) Resp:  [16-18] 16 (10/15 0741) BP: (108-158)/(73-101) 123/79 (10/15 0741) SpO2:  [98 %-100 %] 98 % (10/15 0741) Weight:  [70.5 kg] 70.5 kg (10/15 0500)  Weight change: -1.2 kg Filed Weights   06/27/22 1741 06/29/22 0452 06/30/22 0500  Weight: 74.8 kg 71.7 kg 70.5 kg    Intake/Output: I/O last 3 completed shifts: In: 2930.8 [P.O.:920; I.V.:2010.8] Out: 3086 [Urine:7250]   Intake/Output this shift:  No intake/output data recorded.  Physical Exam: General: Restless, grimacing, ill-appearing  Head: Normocephalic, atraumatic. Moist oral mucosal membranes  Eyes: Anicteric  Lungs:  Clear to auscultation, normal effort  Heart: Regular rate and rhythm  Abdomen:  Soft, nontender  Extremities: No peripheral edema.  Neurologic: Nonfocal, moving all four extremities  Skin: No lesions  Access: None  GU-Foley catheter-pink-tinged urine  Basic Metabolic Panel: Recent Labs  Lab 06/27/22 1507 06/28/22 0753 06/29/22 0426 06/30/22 0623  NA 141 143 141 139  K 5.0 4.5 4.1 4.0  CL 114* 116* 112* 108  CO2 20* 20* 24 23  GLUCOSE 102* 107* 99 100*  BUN 51* 45* 41* 41*  CREATININE 4.26* 3.62* 3.49* 3.27*  CALCIUM 9.3 9.2 8.8* 8.8*    Liver Function Tests: Recent Labs  Lab 06/27/22 1507 06/28/22 0753  AST 18 15  ALT 11 11  ALKPHOS 45 42  BILITOT 0.7 0.9  PROT 8.2*  7.6  ALBUMIN 4.5 4.1   No results for input(s): "LIPASE", "AMYLASE" in the last 168 hours. No results for input(s): "AMMONIA" in the last 168 hours.  CBC: Recent Labs  Lab 06/27/22 1507 06/28/22 0753 06/29/22 0426 06/30/22 0623  WBC 5.7 8.8 7.1 7.0  NEUTROABS 4.1  --   --   --   HGB 10.6* 10.6* 10.3* 10.7*  HCT 32.1* 31.6* 30.2* 32.4*  MCV 84.7 83.2 82.7 83.5   PLT 192 180 190 191    Cardiac Enzymes: No results for input(s): "CKTOTAL", "CKMB", "CKMBINDEX", "TROPONINI" in the last 168 hours.  BNP: Invalid input(s): "POCBNP"  CBG: No results for input(s): "GLUCAP" in the last 168 hours.  Microbiology: Results for orders placed or performed during the hospital encounter of 06/27/22  SARS Coronavirus 2 by RT PCR (hospital order, performed in [facility] hospital lab) *cepheid single result test* Anterior Nasal Swab     Status: None   Collection Time: 06/27/22  7:37 PM   Specimen: Anterior Nasal Swab  Result Value Ref Range Status   SARS Coronavirus 2 by RT PCR NEGATIVE NEGATIVE Final    Comment: (NOTE) SARS-CoV-2 target nucleic acids are NOT DETECTED.  The SARS-CoV-2 RNA is generally detectable in upper and lower respiratory specimens during the acute phase of infection. The lowest concentration of SARS-CoV-2 viral copies this assay can detect is 250 copies / mL. A negative result does not preclude SARS-CoV-2 infection and should not be used as the sole basis for treatment or other patient management decisions.  A negative result may occur with improper specimen collection / handling, submission of specimen other than nasopharyngeal swab, presence of viral mutation(s) within the areas targeted by this assay, and inadequate number of viral copies (<250 copies / mL). A negative result must be combined with clinical observations, patient history, and epidemiological information.  Fact Sheet for Patients:   https://www.patel.info/  Fact Sheet for Healthcare Providers: https://hall.com/  This test is not yet approved or  cleared by the [place] FDA and has been authorized for detection and/or diagnosis of SARS-CoV-2 by FDA under an Emergency Use Authorization (EUA).  This EUA will remain in effect (meaning this test can be used) for the duration of the COVID-19 declaration under Section  564(b)(1) of the Act, 21 U.S.C. section 360bbb-3(b)(1), unless the authorization is terminated or revoked sooner.  Performed at [facility], [address] [place],  [address]     Coagulation Studies: No results for input(s): "LABPROT", "INR" in the last 72 hours.  Urinalysis: No results for input(s): "COLORURINE", "LABSPEC", "PHURINE", "GLUCOSEU", "HGBUR", "BILIRUBINUR", "KETONESUR", "PROTEINUR", "UROBILINOGEN", "NITRITE", "LEUKOCYTESUR" in the last 72 hours.  Invalid input(s): "APPERANCEUR"    Imaging: No results found.   Medications:      amLODipine  5 mg Oral Daily   Chlorhexidine Gluconate Cloth  6 each Topical Daily   sodium chloride flush  3 mL Intravenous Q12H   tamsulosin  0.4 mg Oral Daily   acetaminophen **OR** acetaminophen, albuterol, HYDROcodone-acetaminophen, morphine injection, ondansetron (ZOFRAN) IV  Assessment/ Plan:  Mr. Larone Kliethermes is a 66 y.o.  male with past medical history of hypertension, chronic hydronephrosis, and BPH.  Patient presents to the emergency department at the advice of nephrology for abnormal labs.  Patient will be admitted for Abnormal laboratory test [R89.9] AKI (acute kidney injury) ([place]) [N17.9] Bladder outlet obstruction [N32.0] Benign prostatic hyperplasia with urinary frequency [N40.1, R35.0]   Acute kidney injury on chronic kidney disease stage IIIb.  Baseline creatinine appears to be 1.8 with GFR 38 on 12/05/2021.  CT abdomen pelvis completed on 06/18/2022 shows severe bilateral hydroureteronephrosis with a massively distended urinary bladder.  Foley catheter placed in the emergency department, pink-tinged urine.  Urology consulted to evaluate BPH and hydroureteronephrosis.  Labs did improve with IV fluids and Foley catheter.  Agree with IV fluids.  No acute need for dialysis at this time.  Adequate urine output noted. Patient creatinine is improving.       Patient be creatinine was around 4.3 it has now come  down to 3.27 Educated patient on the improvement in his kidney disease.  Educated patient about need to see the urologist and nephrologist as an outpatient in case he is discharged   2. Anemia of chronic kidney disease Lab Results  Component Value Date   HGB 10.7 (L) 06/30/2022    Hemoglobin within desired target.  We will continue to monitor  3. Secondary Hyperparathyroidism: with outpatient labs: PTH 96, phosphorus 4.2, calcium 9.1 on 06/26/22.   Lab Results  Component Value Date   CALCIUM 8.8 (L) 06/30/2022    We will continue to monitor bone mineral disease labs during this admission.  4.  Hypertension with chronic kidney disease.  Home regimen includes amlodipine and valsartan.  Valsartan currently held in setting of kidney injury.  Blood pressure currently acceptable for this patient.  5. Bilateral hydroureteronephrosis  patient does have an outpatient urology appointment this coming Tuesday   6. Acute metabolic acidosis Acidosis is now better   LOS: 3 Chrissa Meetze s Nazanin Kinner 10/15/20238:51 AM

## 2022-06-30 NOTE — Discharge Summary (Signed)
Physician Discharge Summary   Patient: Mario Valdez MRN: [id number] DOB: [birth year]  Admit date:     06/27/2022  Discharge date: 06/30/22  Discharge Physician: Jimmy Picket Rashidi Loh   PCP: Latanya Maudlin, NP   Recommendations at discharge:    Patient is being discharged with Foley catheter and instructions to follow up with Urology as outpatient on 07/02/22 Dr Diamantina Providence.  Continue hold on ARB until renal function back to baseline Follow up renal function as outpatient in 7 days.  Added tamsulosin for BPH.  Follow up with Thornton Dales NP in 7 days.   Discharge Diagnoses: Active Problems:   Acute kidney injury superimposed on chronic kidney disease (HCC)   Benign prostatic hyperplasia   Hypertension   Prediabetes  Resolved Problems:   * No resolved hospital problems. [facility]: Mario Valdez was admitted to the hospital with the working diagnosis of AKI on CKD stage IV.   66 year old man with hypertension,elevated PSA, BPH previous biopsy in 2021-continue with urology every 6 months, hypertension who has been having issue with urination at home and her blood work done by PCP found to have renal failure creatinine 4.0 BUN 48, hemoglobin 10.6 g 06/26/22. and sent to the ED. he has not gone to her primary care doctor in several years. On his initial physical examination his blood pressure was 157/99, HR 85, RR18 and 02 saturation 100%, lungs with no wheezing or rales, heart with S1 and S2 present and rhythmic, abdomen with no distention, no lower extremity edema.   Na 141, K 5,0 cl 114 bicarbonate 20 glucose 102 bun 51 cr 4,26  Wbc 5,7 hgb 10,6 plt 192  Sars covid 19 negative   CT abdomen and pelvis with severe bilateral hydronephrosis to a massively distended urinary bladder with urinary bladder trabecular thickening and diverticula. Chronic outlet bladder obstruction. Prostatomegaly with median lobe hypertrophy, the prostate gland measuring 8,7 x 5.7 x 4,2 cm   In the ED  Foley inserted 1750 mL urine removed, blood was pink with a small blood clots.  Renal function has bee improving.     Assessment and Plan: Acute kidney injury superimposed on chronic kidney disease ([place]) CKD stage 3b.  Obstructive uropathy due to enlarge prostate.   Patient had Foley cathter inserted with good toleration, his renal function has improved.  He had post obstructive polyuria but able to compensate with oral intake. Patient will be discharge with Foley catheter and follow up with urology as outpatient.   Anemia of chronic renal disease, hgb has been stable at 10,7, plan to follow up as outpatient.  Metabolic bone disease with PTH 96, O 4,2 and Ca 9,1   At the time of his discharge his renal function had a serum cr of  3,27 with K at 4,0 and serum bicarbonate at 23. Plan to follow up renal function as outpatient.   Benign prostatic hyperplasia Enlarged prostate, patient required foley catheter and started on tamsulosin.  Transient hematuria has resolved.  Urology Dr Diamantina Providence was contacted and will follow up with Mario Valdez as outpatient on Tuesday, 07/02/22.     Hypertension Patient had uncontrolled hypertension, that improved with post obstructive diuresis and amlodipine.  Patient will continue to hold on valsartan until renal function back to baseline.  Continue outpatient blood pressure monitoring.   Prediabetes Plan to follow up as outpatient.          Consultants: nephrology  Procedures performed: none  Disposition: Home Diet recommendation:  Cardiac diet DISCHARGE  MEDICATION: Allergies as of 06/30/2022   No Known Allergies      Medication List     STOP taking these medications    valsartan 320 MG tablet Commonly known as: DIOVAN       TAKE these medications    acetaminophen 325 MG tablet Commonly known as: TYLENOL Take 2 tablets (650 mg total) by mouth every 6 (six) hours as needed for mild pain, moderate pain, fever or headache (or  Fever >/= 101).   amLODipine 5 MG tablet Commonly known as: NORVASC Take 1 tablet (5 mg total) by mouth daily.   tamsulosin 0.4 MG Caps capsule Commonly known as: FLOMAX Take 1 capsule (0.4 mg total) by mouth daily. Start taking on: July 01, 2022        Discharge Exam: Danley Danker Weights   06/27/22 1741 06/29/22 0452 06/30/22 0500  Weight: 74.8 kg 71.7 kg 70.5 kg   BP 123/79 (BP Location: Right Arm)   Pulse 73   Temp 97.9 F (36.6 C) (Oral)   Resp 16   Ht 5\' 10"  (1.778 m)   Wt 70.5 kg   SpO2 98%   BMI 22.30 kg/m   Patient is feeling better, with no abdominal pain, no chest pain or dyspnea, no hematuria  Neurology awake and alert ENT with no pallor Cardiovascular with S1 and S2 present with no rubs or murmurs Respiratory with no rales or rhonchi Abdomen with no distention  No lower extremity edema   Condition at discharge: stable  The results of significant diagnostics from this hospitalization (including imaging, microbiology, ancillary and laboratory) are listed below for reference.   Imaging Studies: CT ABDOMEN PELVIS WO CONTRAST  Result Date: 06/19/2022 CLINICAL DATA:  Abnormal examination follow-up. History of elevated PSA EXAM: CT ABDOMEN AND PELVIS WITHOUT CONTRAST TECHNIQUE: Multidetector CT imaging of the abdomen and pelvis was performed following the standard protocol without IV contrast. RADIATION DOSE REDUCTION: This exam was performed according to the departmental dose-optimization program which includes automated exposure control, adjustment of the mA and/or kV according to patient size and/or use of iterative reconstruction technique. COMPARISON:  None Available. FINDINGS: Lower chest: No acute abnormality Hepatobiliary: Unremarkable noncontrast enhanced appearance of the hepatic parenchyma. Gallbladder is unremarkable. No biliary ductal dilation. Pancreas: No pancreatic ductal dilation or evidence of acute inflammation. Spleen: No splenomegaly.  Adrenals/Urinary Tract: Bilateral adrenal glands appear normal. Severe bilateral hydroureteronephrosis to a massively distended urinary bladder. There is trabecular wall thickening and urinary bladder diverticula with the urinary bladder extending to the L3 vertebral body level significantly above the sacral promontory. Stomach/Bowel: Radiopaque enteric contrast material traverses the descending colon. Stomach is minimally distended limiting evaluation. No pathologic dilation of small or large bowel. No evidence of acute bowel inflammation. Mild symmetric rectal wall thickening. Vascular/Lymphatic: Aortic atherosclerosis. No pathologically enlarged abdominal or pelvic lymph nodes. Reproductive: Prostatomegaly with median lobe hypertrophy, the prostate gland measuring approximately 8.7 x 5.7 x 4.2 cm (volume = 110 cm^3) Other: No significant abdominopelvic free fluid. Musculoskeletal: No acute osseous abnormality. IMPRESSION: 1. Severe bilateral hydroureteronephrosis to a massively distended urinary bladder with urinary bladder trabecular thickening and diverticula. Constellation of findings most consistent with chronic bladder outlet obstruction and hydronephrosis related to back pressure. 2. Prostatomegaly with median lobe hypertrophy, the prostate gland measuring approximately 8.7 x 5.7 x 4.2 cm (volume = 110 cc). 3. Mild symmetric rectal wall thickening, nonspecific but possibly reflecting proctitis. Consider further evaluation with colonoscopy to evaluate for underlying mass lesion if not recently performed. 4.  Aortic Atherosclerosis (ICD10-I70.0). Electronically Signed   By: Dahlia Bailiff M.D.   On: 06/19/2022 09:36    Microbiology: Results for orders placed or performed during the hospital encounter of 06/27/22  SARS Coronavirus 2 by RT PCR (hospital order, performed in [facility] hospital lab) *cepheid single result test* Anterior Nasal Swab     Status: None   Collection Time: 06/27/22  7:37 PM    Specimen: Anterior Nasal Swab  Result Value Ref Range Status   SARS Coronavirus 2 by RT PCR NEGATIVE NEGATIVE Final    Comment: (NOTE) SARS-CoV-2 target nucleic acids are NOT DETECTED.  The SARS-CoV-2 RNA is generally detectable in upper and lower respiratory specimens during the acute phase of infection. The lowest concentration of SARS-CoV-2 viral copies this assay can detect is 250 copies / mL. A negative result does not preclude SARS-CoV-2 infection and should not be used as the sole basis for treatment or other patient management decisions.  A negative result may occur with improper specimen collection / handling, submission of specimen other than nasopharyngeal swab, presence of viral mutation(s) within the areas targeted by this assay, and inadequate number of viral copies (<250 copies / mL). A negative result must be combined with clinical observations, patient history, and epidemiological information.  Fact Sheet for Patients:   https://www.patel.info/  Fact Sheet for Healthcare Providers: https://hall.com/  This test is not yet approved or  cleared by the [place] FDA and has been authorized for detection and/or diagnosis of SARS-CoV-2 by FDA under an Emergency Use Authorization (EUA).  This EUA will remain in effect (meaning this test can be used) for the duration of the COVID-19 declaration under Section 564(b)(1) of the Act, 21 U.S.C. section 360bbb-3(b)(1), unless the authorization is terminated or revoked sooner.  Performed at [facility], [address] [place], [place] [address]     Labs: CBC: Recent Labs  Lab 06/27/22 1507 06/28/22 0753 06/29/22 0426 06/30/22 0623  WBC 5.7 8.8 7.1 7.0  NEUTROABS 4.1  --   --   --   HGB 10.6* 10.6* 10.3* 10.7*  HCT 32.1* 31.6* 30.2* 32.4*  MCV 84.7 83.2 82.7 83.5  PLT 192 180 190 071   Basic Metabolic Panel: Recent Labs  Lab 06/27/22 1507 06/28/22 0753  06/29/22 0426 06/30/22 0623  NA 141 143 141 139  K 5.0 4.5 4.1 4.0  CL 114* 116* 112* 108  CO2 20* 20* 24 23  GLUCOSE 102* 107* 99 100*  BUN 51* 45* 41* 41*  CREATININE 4.26* 3.62* 3.49* 3.27*  CALCIUM 9.3 9.2 8.8* 8.8*   Liver Function Tests: Recent Labs  Lab 06/27/22 1507 06/28/22 0753  AST 18 15  ALT 11 11  ALKPHOS 45 42  BILITOT 0.7 0.9  PROT 8.2* 7.6  ALBUMIN 4.5 4.1   CBG: No results for input(s): "GLUCAP" in the last 168 hours.  Discharge time spent: greater than 30 minutes.  Signed: Tawni Millers, MD [facility] 06/30/2022

## 2022-06-30 NOTE — Assessment & Plan Note (Signed)
Plan to follow up as outpatient.  

## 2022-06-30 NOTE — Assessment & Plan Note (Addendum)
Enlarged prostate, patient required foley catheter and started on tamsulosin.  Transient hematuria has resolved.  Urology Dr Diamantina Providence was contacted and will follow up with Mario Valdez as outpatient on Tuesday, 07/02/22.

## 2022-07-02 ENCOUNTER — Other Ambulatory Visit: Payer: Self-pay | Admitting: Urology

## 2022-07-02 ENCOUNTER — Encounter: Payer: Self-pay | Admitting: Urology

## 2022-07-02 ENCOUNTER — Ambulatory Visit (INDEPENDENT_AMBULATORY_CARE_PROVIDER_SITE_OTHER): Payer: BC Managed Care – PPO | Admitting: Urology

## 2022-07-02 VITALS — BP 127/84 | HR 90 | Ht 70.0 in | Wt 160.0 lb

## 2022-07-02 DIAGNOSIS — N401 Enlarged prostate with lower urinary tract symptoms: Secondary | ICD-10-CM

## 2022-07-02 DIAGNOSIS — N179 Acute kidney failure, unspecified: Secondary | ICD-10-CM | POA: Diagnosis not present

## 2022-07-02 DIAGNOSIS — N138 Other obstructive and reflux uropathy: Secondary | ICD-10-CM | POA: Diagnosis not present

## 2022-07-02 DIAGNOSIS — N189 Chronic kidney disease, unspecified: Secondary | ICD-10-CM | POA: Diagnosis not present

## 2022-07-02 DIAGNOSIS — N1330 Unspecified hydronephrosis: Secondary | ICD-10-CM

## 2022-07-02 DIAGNOSIS — R339 Retention of urine, unspecified: Secondary | ICD-10-CM

## 2022-07-02 NOTE — Progress Notes (Signed)
Surgical Physician Order Form [facility]  * Scheduling expectation :  Friday 10/20  *Length of Case: 2 hours  *Clearance needed: no  *Anticoagulation Instructions: Hold all anticoagulants  *Aspirin Instructions: Hold Aspirin  *Post-op visit Date/Instructions:  1-3 day voiding trial  *Diagnosis: BPH w/urinary obstruction  *Procedure:     HOLEP (50871)   Additional orders: N/A  -Admit type: OUTpatient  -Anesthesia: General  -VTE Prophylaxis Standing Order SCD's       Other:   -Standing Lab Orders Per Anesthesia    Lab other: None  -Standing Test orders EKG/Chest x-ray per Anesthesia       Test other:   - Medications:  Ancef 2gm IV  -Other orders:  N/A

## 2022-07-02 NOTE — Patient Instructions (Signed)

## 2022-07-02 NOTE — Progress Notes (Signed)
07/02/2022 2:21 PM   Mario Valdez [birth year] [id number]  Reason for visit: Urinary retention, bilateral hydronephrosis and AKI on CKD  HPI: 66 year old male who previously was followed by Dr. Junious Silk and had a long history of an elevated PSA.  He underwent a prostate biopsy in 2021 for an elevated PSA of 8.6 which showed a 100 g prostate with benign tissue.  He did not follow-up routinely after that.  PSA in March 2023 remained elevated at 12.3, and he deferred prostate MRI.  He had mild urinary symptoms at baseline of urinary frequency and nocturia 1-3 times overnight.  He recently underwent CT for lower abdominal distention.  I personally viewed and interpreted the CT from 06/18/2022 that shows distended bladder, prostate measures 71 g, bilateral hydroureteronephrosis.  Renal function also worsened from a baseline of 1.8, EGFR 38 in March 2023 to creatinine 4.06, EGFR 16 on October 11th 2023.  He was sent to the ER for his elevated creatinine and a Foley catheter was placed with 1780m urine return.  He was hospitalized and creatinine began to downtrend.  Notes from hospitalization reviewed, creatinine at discharge 3.27, EGFR 20  We discussed BPH as the most likely etiology of his obstruction causing upstream hydronephrosis and renal failure prostate cancer also possible, but less likely with his recent negative prostate biopsy.  We reviewed treatment options including chronic Foley with monthly changes, intermittent catheterization, or outlet procedures.  We reviewed that HOLEP would be the most definitive outlet procedure to resume spontaneous voiding.  We discussed the risks and benefits of HoLEP at length.  The procedure requires general anesthesia and takes 1 to 2 hours, and a holmium laser is used to enucleate the prostate and push this tissue into the bladder.  A morcellator is then used to remove this tissue, which is sent for pathology.  The vast majority(>95%) of patients are able to  discharge the same day with a catheter in place for 2 to 3 days, and will follow-up in clinic for a voiding trial.  We specifically discussed the risks of bleeding, infection, retrograde ejaculation, temporary urgency and urge incontinence, very low risk of long-term incontinence, urethral stricture/bladder neck contracture, pathologic evaluation of prostate tissue and possible detection of prostate cancer or other malignancy, and possible need for additional procedures.  Schedule HOLEP this Friday  [facility] [address] [place] [phone]

## 2022-07-02 NOTE — Progress Notes (Signed)
[facility] Posting Form   Surgery Date/Time: Date: 07/05/2022  Surgeon: Dr. Nickolas Madrid, MD  Surgery Location: Day Surgery  Inpt ( No  )   Outpt (Yes)   Obs ( No  )   Diagnosis: N40.1, N13.8 Benign Prostatic Hyperplasia with Urinary Obstruction   -CPT: 97416  Surgery: Holmium Laser Enucleation of the Prostate  Stop Anticoagulations: Yes and hold ASA  Cardiac/Medical/Pulmonary Clearance needed: no  *Orders entered into EPIC  Date: 07/02/22   *Case booked in EPIC  Date: 07/02/22  *Notified pt of Surgery: Date: 07/02/22  PRE-OP UA & CX: no  *Placed into Prior Authorization Work Que Date: 07/02/22   Assistant/laser/rep:No

## 2022-07-03 ENCOUNTER — Ambulatory Visit: Payer: BC Managed Care – PPO | Admitting: Urology

## 2022-07-04 ENCOUNTER — Encounter
Admission: RE | Admit: 2022-07-04 | Discharge: 2022-07-04 | Disposition: A | Discharge status: Home / Self Care | Payer: No Typology Code available for payment source | Source: Ambulatory Visit | Attending: Urology | Admitting: Urology

## 2022-07-04 DIAGNOSIS — Z01818 Encounter for other preprocedural examination: Secondary | ICD-10-CM

## 2022-07-04 DIAGNOSIS — I10 Essential (primary) hypertension: Secondary | ICD-10-CM

## 2022-07-04 HISTORY — DX: Elevated prostate specific antigen (PSA): R97.20

## 2022-07-04 HISTORY — DX: Acute kidney failure, unspecified: N17.9

## 2022-07-04 HISTORY — DX: Essential (primary) hypertension: I10

## 2022-07-04 HISTORY — DX: Benign prostatic hyperplasia without lower urinary tract symptoms: N40.0

## 2022-07-04 HISTORY — DX: Prediabetes: R73.03

## 2022-07-04 NOTE — Pre-Procedure Instructions (Signed)
Pt stated during anesthesia phone interview that he has stopped taking his Amlodipine on his own, not per his pcp, bc his bp is normal. Pt states he has been off Amlodipine for 1 week now. Pt currently on NO bp medication

## 2022-07-04 NOTE — Patient Instructions (Signed)
Your procedure is scheduled on:07-05-22 Friday Report to the Registration Desk on the 1st floor of the [facility].Then proceed to the 2nd floor Surgery Desk To find out your arrival time, please call [phone] between 1PM - 3PM on:07-04-22 Thursday If your arrival time is 6:00 am, do not arrive prior to that time as the [facility] entrance doors do not open until 6:00 am.  REMEMBER: Instructions that are not followed completely may result in serious medical risk, up to and including death; or upon the discretion of your surgeon and anesthesiologist your surgery may need to be rescheduled.  Do not eat food OR drink any liquids after midnight the night before surgery.  No gum chewing, lozengers or hard candies.  TAKE THESE MEDICATIONS THE MORNING OF SURGERY WITH A SIP OF WATER: -tamsulosin (FLOMAX)   One week prior to surgery: Stop Anti-inflammatories (NSAIDS) such as Advil, Aleve, Ibuprofen, Motrin, Naproxen, Naprosyn and Aspirin based products such as Excedrin, Goodys Powder, BC Powder. You may however, continue to take Tylenol if needed for pain up until the day of surgery.  No Alcohol for 24 hours before or after surgery.  No Smoking including e-cigarettes for 24 hours prior to surgery.  No chewable tobacco products for at least 6 hours prior to surgery.  No nicotine patches on the day of surgery.  Do not use any "recreational" drugs for at least a week prior to your surgery.  Please be advised that the combination of cocaine and anesthesia may have negative outcomes, up to and including death. If you test positive for cocaine, your surgery will be cancelled.  On the morning of surgery brush your teeth with toothpaste and water, you may rinse your mouth with mouthwash if you wish. Do not swallow any toothpaste or mouthwash.  Do not wear jewelry, make-up, hairpins, clips or nail polish.  Do not wear lotions, powders, or perfumes.   Do not shave body from the neck down 48  hours prior to surgery just in case you cut yourself which could leave a site for infection.  Also, freshly shaved skin may become irritated if using the CHG soap.  Contact lenses, hearing aids and dentures may not be worn into surgery.  Do not bring valuables to the hospital. [facility] is not responsible for any missing/lost belongings or valuables.   Notify your doctor if there is any change in your medical condition (cold, fever, infection).  Wear comfortable clothing (specific to your surgery type) to the hospital.  After surgery, you can help prevent lung complications by doing breathing exercises.  Take deep breaths and cough every 1-2 hours. Your doctor may order a device called an Incentive Spirometer to help you take deep breaths. When coughing or sneezing, hold a pillow firmly against your incision with both hands. This is called "splinting." Doing this helps protect your incision. It also decreases belly discomfort.  If you are being admitted to the hospital overnight, leave your suitcase in the car. After surgery it may be brought to your room.  If you are being discharged the day of surgery, you will not be allowed to drive home. You will need a responsible adult (18 years or older) to drive you home and stay with you that night.   If you are taking public transportation, you will need to have a responsible adult (18 years or older) with you. Please confirm with your physician that it is acceptable to use public transportation.   Please call the Pre-admissions  Testing Dept. at [phone] if you have any questions about these instructions.  Surgery Visitation Policy:  Patients undergoing a surgery or procedure may have two family members or support persons with them as long as the person is not COVID-19 positive or experiencing its symptoms.

## 2022-07-05 ENCOUNTER — Ambulatory Visit
Admission: RE | Admit: 2022-07-05 | Discharge: 2022-07-05 | Disposition: A | Discharge status: Home / Self Care | Payer: BC Managed Care – PPO | Source: Ambulatory Visit | Attending: Urology | Admitting: Urology

## 2022-07-05 ENCOUNTER — Encounter: Payer: Self-pay | Admitting: Urology

## 2022-07-05 ENCOUNTER — Ambulatory Visit: Payer: BC Managed Care – PPO | Admitting: General Practice

## 2022-07-05 ENCOUNTER — Encounter
Admission: RE | Disposition: A | Discharge status: Home / Self Care | Payer: Self-pay | Source: Ambulatory Visit | Attending: Urology

## 2022-07-05 ENCOUNTER — Other Ambulatory Visit: Payer: Self-pay

## 2022-07-05 DIAGNOSIS — Z01818 Encounter for other preprocedural examination: Secondary | ICD-10-CM

## 2022-07-05 DIAGNOSIS — N134 Hydroureter: Secondary | ICD-10-CM | POA: Diagnosis not present

## 2022-07-05 DIAGNOSIS — N3289 Other specified disorders of bladder: Secondary | ICD-10-CM | POA: Insufficient documentation

## 2022-07-05 DIAGNOSIS — N401 Enlarged prostate with lower urinary tract symptoms: Secondary | ICD-10-CM | POA: Diagnosis present

## 2022-07-05 DIAGNOSIS — N138 Other obstructive and reflux uropathy: Secondary | ICD-10-CM | POA: Diagnosis not present

## 2022-07-05 DIAGNOSIS — N1330 Unspecified hydronephrosis: Secondary | ICD-10-CM | POA: Insufficient documentation

## 2022-07-05 DIAGNOSIS — I10 Essential (primary) hypertension: Secondary | ICD-10-CM

## 2022-07-05 HISTORY — PX: HOLEP-LASER ENUCLEATION OF THE PROSTATE WITH MORCELLATION: SHX6641

## 2022-07-05 SURGERY — ENUCLEATION, PROSTATE, USING LASER, WITH MORCELLATION
Anesthesia: General | Site: Prostate

## 2022-07-05 MED ORDER — CHLORHEXIDINE GLUCONATE 0.12 % MT SOLN: 15.0000 mL | Freq: Once | OROMUCOSAL | Status: AC

## 2022-07-05 MED ORDER — OXYCODONE HCL 5 MG PO TABS
ORAL_TABLET | ORAL | Status: AC
  Filled 2022-07-05: qty 1

## 2022-07-05 MED ORDER — HYDROCODONE-ACETAMINOPHEN 5-325 MG PO TABS: 1.0000 | ORAL_TABLET | Freq: Four times a day (QID) | ORAL | 0 refills | Status: AC | PRN

## 2022-07-05 MED ORDER — ORAL CARE MOUTH RINSE: 15.0000 mL | Freq: Once | OROMUCOSAL | Status: AC

## 2022-07-05 MED ORDER — PHENYLEPHRINE 80 MCG/ML (10ML) SYRINGE FOR IV PUSH (FOR BLOOD PRESSURE SUPPORT)
PREFILLED_SYRINGE | INTRAVENOUS | Status: AC
  Filled 2022-07-05: qty 10

## 2022-07-05 MED ORDER — CEFAZOLIN SODIUM-DEXTROSE 2-4 GM/100ML-% IV SOLN
2.0000 g | INTRAVENOUS | Status: AC
  Administered 2022-07-05: 2 g via INTRAVENOUS

## 2022-07-05 MED ORDER — GLYCOPYRROLATE 0.2 MG/ML IJ SOLN
INTRAMUSCULAR | Status: AC
  Filled 2022-07-05: qty 1

## 2022-07-05 MED ORDER — ONDANSETRON HCL 4 MG/2ML IJ SOLN
INTRAMUSCULAR | Status: DC | PRN
  Administered 2022-07-05: 4 mg via INTRAVENOUS

## 2022-07-05 MED ORDER — ACETAMINOPHEN 10 MG/ML IV SOLN
INTRAVENOUS | Status: AC
  Filled 2022-07-05: qty 100

## 2022-07-05 MED ORDER — SODIUM CHLORIDE 0.9 % IR SOLN
Status: DC | PRN
  Administered 2022-07-05 (×2): 3000 mL via INTRAVESICAL

## 2022-07-05 MED ORDER — OXYCODONE HCL 5 MG/5ML PO SOLN: 5.0000 mg | Freq: Once | ORAL | Status: AC | PRN

## 2022-07-05 MED ORDER — SUGAMMADEX SODIUM 200 MG/2ML IV SOLN
INTRAVENOUS | Status: DC | PRN
  Administered 2022-07-05: 200 mg via INTRAVENOUS

## 2022-07-05 MED ORDER — ESMOLOL HCL 100 MG/10ML IV SOLN
INTRAVENOUS | Status: DC | PRN
  Administered 2022-07-05: 10 mg via INTRAVENOUS
  Administered 2022-07-05: 20 mg via INTRAVENOUS

## 2022-07-05 MED ORDER — PROPOFOL 10 MG/ML IV BOLUS
INTRAVENOUS | Status: AC
  Filled 2022-07-05: qty 20

## 2022-07-05 MED ORDER — EPHEDRINE 5 MG/ML INJ
INTRAVENOUS | Status: AC
  Filled 2022-07-05: qty 5

## 2022-07-05 MED ORDER — PROPOFOL 10 MG/ML IV BOLUS
INTRAVENOUS | Status: DC | PRN
  Administered 2022-07-05: 150 mg via INTRAVENOUS

## 2022-07-05 MED ORDER — FENTANYL CITRATE (PF) 100 MCG/2ML IJ SOLN
INTRAMUSCULAR | Status: AC
  Administered 2022-07-05: 25 ug via INTRAVENOUS
  Filled 2022-07-05: qty 2

## 2022-07-05 MED ORDER — ONDANSETRON HCL 4 MG/2ML IJ SOLN
INTRAMUSCULAR | Status: AC
  Filled 2022-07-05: qty 2

## 2022-07-05 MED ORDER — CEFAZOLIN SODIUM-DEXTROSE 2-4 GM/100ML-% IV SOLN
INTRAVENOUS | Status: AC
  Filled 2022-07-05: qty 100

## 2022-07-05 MED ORDER — FENTANYL CITRATE (PF) 100 MCG/2ML IJ SOLN
INTRAMUSCULAR | Status: AC
  Filled 2022-07-05: qty 2

## 2022-07-05 MED ORDER — CHLORHEXIDINE GLUCONATE 0.12 % MT SOLN
OROMUCOSAL | Status: AC
  Administered 2022-07-05: 15 mL via OROMUCOSAL
  Filled 2022-07-05: qty 15

## 2022-07-05 MED ORDER — DEXAMETHASONE SODIUM PHOSPHATE 10 MG/ML IJ SOLN
INTRAMUSCULAR | Status: AC
  Filled 2022-07-05: qty 1

## 2022-07-05 MED ORDER — FAMOTIDINE 20 MG PO TABS: 20.0000 mg | ORAL_TABLET | Freq: Once | ORAL | Status: AC

## 2022-07-05 MED ORDER — FENTANYL CITRATE (PF) 100 MCG/2ML IJ SOLN
25.0000 ug | INTRAMUSCULAR | Status: DC | PRN
  Administered 2022-07-05 (×3): 25 ug via INTRAVENOUS
  Administered 2022-07-05: 50 ug via INTRAVENOUS

## 2022-07-05 MED ORDER — PHENYLEPHRINE HCL-NACL 20-0.9 MG/250ML-% IV SOLN
INTRAVENOUS | Status: DC | PRN
  Administered 2022-07-05: 25 ug/min via INTRAVENOUS

## 2022-07-05 MED ORDER — PHENYLEPHRINE HCL (PRESSORS) 10 MG/ML IV SOLN
INTRAVENOUS | Status: DC | PRN
  Administered 2022-07-05: 80 ug via INTRAVENOUS
  Administered 2022-07-05 (×2): 320 ug via INTRAVENOUS
  Administered 2022-07-05 (×2): 80 ug via INTRAVENOUS
  Administered 2022-07-05: 160 ug via INTRAVENOUS

## 2022-07-05 MED ORDER — FAMOTIDINE 20 MG PO TABS
ORAL_TABLET | ORAL | Status: AC
  Administered 2022-07-05: 20 mg via ORAL
  Filled 2022-07-05: qty 1

## 2022-07-05 MED ORDER — LACTATED RINGERS IV SOLN: INTRAVENOUS | Status: DC

## 2022-07-05 MED ORDER — LIDOCAINE HCL (PF) 2 % IJ SOLN
INTRAMUSCULAR | Status: AC
  Filled 2022-07-05: qty 5

## 2022-07-05 MED ORDER — ROCURONIUM BROMIDE 10 MG/ML (PF) SYRINGE
PREFILLED_SYRINGE | INTRAVENOUS | Status: AC
  Filled 2022-07-05: qty 10

## 2022-07-05 MED ORDER — FENTANYL CITRATE (PF) 100 MCG/2ML IJ SOLN
INTRAMUSCULAR | Status: DC | PRN
  Administered 2022-07-05: 25 ug via INTRAVENOUS
  Administered 2022-07-05: 50 ug via INTRAVENOUS
  Administered 2022-07-05: 25 ug via INTRAVENOUS

## 2022-07-05 MED ORDER — ROCURONIUM BROMIDE 100 MG/10ML IV SOLN
INTRAVENOUS | Status: DC | PRN
  Administered 2022-07-05: 60 mg via INTRAVENOUS

## 2022-07-05 MED ORDER — OXYCODONE HCL 5 MG PO TABS
5.0000 mg | ORAL_TABLET | Freq: Once | ORAL | Status: AC | PRN
  Administered 2022-07-05: 5 mg via ORAL

## 2022-07-05 MED ORDER — DEXAMETHASONE SODIUM PHOSPHATE 10 MG/ML IJ SOLN
INTRAMUSCULAR | Status: DC | PRN
  Administered 2022-07-05: 5 mg via INTRAVENOUS

## 2022-07-05 MED ORDER — LIDOCAINE HCL (CARDIAC) PF 100 MG/5ML IV SOSY
PREFILLED_SYRINGE | INTRAVENOUS | Status: DC | PRN
  Administered 2022-07-05: 100 mg via INTRAVENOUS

## 2022-07-05 MED ORDER — ESMOLOL HCL 100 MG/10ML IV SOLN
INTRAVENOUS | Status: AC
  Filled 2022-07-05: qty 10

## 2022-07-05 MED ORDER — ACETAMINOPHEN 10 MG/ML IV SOLN
INTRAVENOUS | Status: DC | PRN
  Administered 2022-07-05: 1000 mg via INTRAVENOUS

## 2022-07-05 MED ORDER — EPHEDRINE SULFATE (PRESSORS) 50 MG/ML IJ SOLN
INTRAMUSCULAR | Status: DC | PRN
  Administered 2022-07-05: 5 mg via INTRAVENOUS
  Administered 2022-07-05: 10 mg via INTRAVENOUS
  Administered 2022-07-05: 5 mg via INTRAVENOUS

## 2022-07-05 SURGICAL SUPPLY — 37 items
ADAPTER IRRIG TUBE 2 SPIKE SOL (ADAPTER) ×2 IMPLANT
ADPR TBG 2 SPK PMP STRL ASCP (ADAPTER) ×2
BAG DRN LRG CPC RND TRDRP CNTR (MISCELLANEOUS) ×1
BAG URO DRAIN 4000ML (MISCELLANEOUS) ×1 IMPLANT
CATH FOLEY 3WAY 30CC 24FR (CATHETERS) ×1
CATH URETL OPEN END 4X70 (CATHETERS) ×1 IMPLANT
CATH URTH STD 24FR FL 3W 2 (CATHETERS) ×1 IMPLANT
CONTAINER COLLECT MORCELLATR (MISCELLANEOUS) ×1 IMPLANT
DRAPE UTILITY 15X26 TOWEL STRL (DRAPES) IMPLANT
ELECT BIVAP BIPO 22/24 DONUT (ELECTROSURGICAL)
ELECTRD BIVAP BIPO 22/24 DONUT (ELECTROSURGICAL) IMPLANT
FIBER LASER MOSES 550 DFL (Laser) ×1 IMPLANT
FILTER OVERFLOW MORCELLATOR (FILTER) ×1 IMPLANT
GLOVE SURG UNDER POLY LF SZ7.5 (GLOVE) ×1 IMPLANT
GOWN STRL REUS W/ TWL LRG LVL3 (GOWN DISPOSABLE) ×1 IMPLANT
GOWN STRL REUS W/ TWL XL LVL3 (GOWN DISPOSABLE) ×1 IMPLANT
GOWN STRL REUS W/TWL LRG LVL3 (GOWN DISPOSABLE) ×1
GOWN STRL REUS W/TWL XL LVL3 (GOWN DISPOSABLE) ×1
HOLDER FOLEY CATH W/STRAP (MISCELLANEOUS) ×1 IMPLANT
IV NS IRRIG 3000ML ARTHROMATIC (IV SOLUTION) ×5 IMPLANT
KIT TURNOVER CYSTO (KITS) ×1 IMPLANT
MANIFOLD NEPTUNE II (INSTRUMENTS) IMPLANT
MBRN O SEALING YLW 17 FOR INST (MISCELLANEOUS) ×1
MEMBRANE SLNG YLW 17 FOR INST (MISCELLANEOUS) ×1 IMPLANT
MORCELLATOR COLLECT CONTAINER (MISCELLANEOUS) ×1
MORCELLATOR OVERFLOW FILTER (FILTER) ×1
MORCELLATOR ROTATION 4.75 335 (MISCELLANEOUS) ×1 IMPLANT
PACK CYSTO AR (MISCELLANEOUS) ×1 IMPLANT
SET CYSTO W/LG BORE CLAMP LF (SET/KITS/TRAYS/PACK) ×1 IMPLANT
SET IRRIG Y TYPE TUR BLADDER L (SET/KITS/TRAYS/PACK) ×1 IMPLANT
SLEEVE PROTECTION STRL DISP (MISCELLANEOUS) ×2 IMPLANT
SURGILUBE 2OZ TUBE FLIPTOP (MISCELLANEOUS) ×1 IMPLANT
SYR TOOMEY IRRIG 70ML (MISCELLANEOUS) ×1
SYRINGE TOOMEY IRRIG 70ML (MISCELLANEOUS) ×1 IMPLANT
TRAP FLUID SMOKE EVACUATOR (MISCELLANEOUS) ×1 IMPLANT
TUBE PUMP MORCELLATOR PIRANHA (TUBING) ×1 IMPLANT
WATER STERILE IRR 1000ML POUR (IV SOLUTION) ×1 IMPLANT

## 2022-07-05 NOTE — Anesthesia Procedure Notes (Signed)
Procedure Name: Intubation Date/Time: 07/05/2022 10:16 AM  Performed by: Lia Foyer, CRNAPre-anesthesia Checklist: Patient identified, Emergency Drugs available, Suction available and Patient being monitored Patient Re-evaluated:Patient Re-evaluated prior to induction Oxygen Delivery Method: Circle system utilized Preoxygenation: Pre-oxygenation with 100% oxygen Induction Type: IV induction Ventilation: Mask ventilation without difficulty Laryngoscope Size: McGraph and 4 Grade View: Grade I Tube type: Oral Tube size: 7.0 mm Number of attempts: 1 Airway Equipment and Method: Stylet and Video-laryngoscopy Placement Confirmation: ETT inserted through vocal cords under direct vision, positive ETCO2 and breath sounds checked- equal and bilateral Secured at: 21 cm Tube secured with: Tape Dental Injury: Teeth and Oropharynx as per pre-operative assessment

## 2022-07-05 NOTE — Op Note (Signed)
Date of procedure: 07/05/22  Preoperative diagnosis:  BPH with obstruction Bilateral hydroureteronephrosis  Postoperative diagnosis:  Same  Procedure: HoLEP (Holmium Laser Enucleation of the Prostate)  Surgeon: Nickolas Madrid, MD  Anesthesia: General  Complications: None  Intraoperative findings:  Large prostate with obstructive lateral lobes, moderate bladder trabeculations, no suspicious lesions Ureteral orifices and verumontanum intact at conclusion of case  EBL: Minimal  Specimens: Prostate chips  Enucleation time: 25 minutes  Morcellation time: 50 minutes  Drains: 24 French three-way, 60 cc in balloon  Indication: Karver Fadden is a 66 y.o. patient with BPH with obstruction who presented with bilateral hydroureteronephrosis and renal failure and Foley was placed, he presents today for HOLEP.  After reviewing the management options for treatment, they elected to proceed with the above surgical procedure(s). We have discussed the potential benefits and risks of the procedure, side effects of the proposed treatment, the likelihood of the patient achieving the goals of the procedure, and any potential problems that might occur during the procedure or recuperation.  We specifically discussed the risks of bleeding, infection, hematuria and clot retention, need for additional procedures, possible overnight hospital stay, temporary urgency and incontinence, rare long-term incontinence, and retrograde ejaculation.  Informed consent has been obtained.   Description of procedure:  The patient was taken to the operating room and general anesthesia was induced.  The patient was placed in the dorsal lithotomy position, prepped and draped in the usual sterile fashion, and preoperative antibiotics were administered.  SCDs were placed for DVT prophylaxis.  A preoperative time-out was performed.   Leander Rams sounds were used to gently dilated the urethra up to 41F. The 28 French continuous flow  resectoscope was inserted into the urethra using the visual obturator  The prostate was large with obstructing lateral lobes, left larger than right. The bladder was thoroughly inspected and notable for moderate trabeculations, no suspicious lesions, mild catheter erythema.  The ureteral orifices were located in orthotopic position.  The laser was set to 2 J and 60 Hz and was used to make a lambda incision just proximal to the verumontanum down to the level of the capsule. Early apical release was performed by making a circumferential mucosal incision proximal to the sphincter.  The prostate was enucleated en bloc into the bladder circumferentially.   The capsule was examined and laser was used for meticulous hemostasis.    The 73 French resectoscope was then switched out for the 34 French nephroscope and the lobes were morcellated and the tissue sent to pathology.  A 24 French three-way catheter was inserted easily with the aid of a catheter guide, and see cc were placed in the balloon.  Urine was clear.  The catheter irrigated easily with a Toomey syringe.  CBI was initiated.   The patient tolerated the procedure well without any immediate complications and was extubated and transferred to the recovery room in stable condition.  Urine was clear on fast CBI.  Disposition: Stable to PACU  Plan: Wean CBI in PACU, anticipate discharge home today with voiding trial in clinic in 2-3 days  Nickolas Madrid, MD 07/05/2022

## 2022-07-05 NOTE — Transfer of Care (Signed)
Immediate Anesthesia Transfer of Care Note  Patient: Mario Valdez  Procedure(s) Performed: HOLEP-LASER ENUCLEATION OF THE PROSTATE WITH MORCELLATION (Prostate)  Patient Location: PACU  Anesthesia Type:General  Level of Consciousness: drowsy  Airway & Oxygen Therapy: Patient Spontanous Breathing and Patient connected to face mask oxygen  Post-op Assessment: Report given to RN and Post -op Vital signs reviewed and stable  Post vital signs: Reviewed and stable  Last Vitals:  Vitals Value Taken Time  BP 142/89 07/05/22 1207  Temp    Pulse 99 07/05/22 1210  Resp 14 07/05/22 1210  SpO2 100 % 07/05/22 1210  Vitals shown include unvalidated device data.  Last Pain:  Vitals:   07/05/22 0733  TempSrc: Oral  PainSc: 0-No pain         Complications: No notable events documented.

## 2022-07-05 NOTE — Anesthesia Preprocedure Evaluation (Signed)
Anesthesia Evaluation  Patient identified by MRN, date of birth, ID band Patient awake    Reviewed: Allergy & Precautions, NPO status , Patient's Chart, lab work & pertinent test results  History of Anesthesia Complications Negative for: history of anesthetic complications  Airway Mallampati: III  TM Distance: >3 FB Neck ROM: full    Dental  (+) Dental Advidsory Given, Poor Dentition   Pulmonary neg pulmonary ROS, neg shortness of breath, neg sleep apnea, neg COPD,    Pulmonary exam normal        Cardiovascular hypertension, (-) angina(-) Past MI and (-) CABG negative cardio ROS Normal cardiovascular exam     Neuro/Psych negative neurological ROS  negative psych ROS   GI/Hepatic negative GI ROS, Neg liver ROS,   Endo/Other  negative endocrine ROS  Renal/GU Renal disease     Musculoskeletal   Abdominal   Peds  Hematology negative hematology ROS (+)   Anesthesia Other Findings Past Medical History: No date: AKI (acute kidney injury) ([place]) No date: BPH (benign prostatic hyperplasia) No date: Elevated PSA No date: Hypertension No date: Pre-diabetes  Past Surgical History: 09/16/2008: COLONOSCOPY     Comment:  polyp removal     Reproductive/Obstetrics negative OB ROS                             Anesthesia Physical Anesthesia Plan  ASA: 2  Anesthesia Plan: General ETT   Post-op Pain Management:    Induction: Intravenous  PONV Risk Score and Plan: 3 and Ondansetron, Dexamethasone and Midazolam  Airway Management Planned: Oral ETT  Additional Equipment:   Intra-op Plan:   Post-operative Plan: Extubation in OR  Informed Consent: I have reviewed the patients History and Physical, chart, labs and discussed the procedure including the risks, benefits and alternatives for the proposed anesthesia with the patient or authorized representative who has indicated his/her  understanding and acceptance.     Dental Advisory Given  Plan Discussed with: Anesthesiologist, CRNA and Surgeon  Anesthesia Plan Comments: (Patient consented for risks of anesthesia including but not limited to:  - adverse reactions to medications - damage to eyes, teeth, lips or other oral mucosa - nerve damage due to positioning  - sore throat or hoarseness - Damage to heart, brain, nerves, lungs, other parts of body or loss of life  Patient voiced understanding.)        Anesthesia Quick Evaluation

## 2022-07-05 NOTE — H&P (Signed)
07/05/22 9:57 AM   Mario Valdez [birth year] [id number]  CC: BPH and retention  HPI: 66 year old male who previously was followed by Dr. Junious Silk and had a long history of an elevated PSA.  He underwent a prostate biopsy in 2021 for an elevated PSA of 8.6 which showed a 100 g prostate with benign tissue.  He did not follow-up routinely after that.  PSA in March 2023 remained elevated at 12.3, and he deferred prostate MRI.  He had mild urinary symptoms at baseline of urinary frequency and nocturia 1-3 times overnight.   He recently underwent CT for lower abdominal distention.  I personally viewed and interpreted the CT from 06/18/2022 that shows distended bladder, prostate measures 71 g, bilateral hydroureteronephrosis.   Renal function also worsened from a baseline of 1.8, EGFR 38 in March 2023 to creatinine 4.06, EGFR 16 on October 11th 2023.  He was sent to the ER for his elevated creatinine and a Foley catheter was placed with 1739m urine return.  He was hospitalized and creatinine began to downtrend.  Notes from hospitalization reviewed, creatinine at discharge 3.27, EGFR 20   We discussed BPH as the most likely etiology of his obstruction causing upstream hydronephrosis and renal failure prostate cancer also possible, but less likely with his recent negative prostate biopsy.  We reviewed treatment options including chronic Foley with monthly changes, intermittent catheterization, or outlet procedures.  We reviewed that HOLEP would be the most definitive outlet procedure to resume spontaneous voiding.   Surgical History: Past Surgical History:  Procedure Laterality Date   COLONOSCOPY  09/16/2008   polyp removal     Family History: Family History  Problem Relation Age of Onset   Prostate cancer Neg Hx    Bladder Cancer Neg Hx    Kidney cancer Neg Hx    Urolithiasis Neg Hx     Social History:  reports that he has never smoked. He has never been exposed to tobacco smoke. He has  never used smokeless tobacco. He reports that he does not drink alcohol and does not use drugs.  Physical Exam: BP (!) 141/83   Pulse 81   Resp 18   SpO2 95%    Constitutional:  Alert and oriented, No acute distress. Cardiovascular: RRR Respiratory: CTA bilaterally GI: Abdomen is soft, nontender, nondistended, no abdominal masses   Laboratory Data: UA Benign  Assessment & Plan:   66yo M with BPH and worsening symptoms cluminating in upstream hydronephrosis and renal failure, here today for HoLEP.  We discussed the risks and benefits of HoLEP at length.  The procedure requires general anesthesia and takes 1 to 2 hours, and a holmium laser is used to enucleate the prostate and push this tissue into the bladder.  A morcellator is then used to remove this tissue, which is sent for pathology.  The vast majority(>95%) of patients are able to discharge the same day with a catheter in place for 2 to 3 days, and will follow-up in clinic for a voiding trial.  We specifically discussed the risks of bleeding, infection, retrograde ejaculation, temporary urgency and urge incontinence, very low risk of long-term incontinence, urethral stricture/bladder neck contracture, pathologic evaluation of prostate tissue and possible detection of prostate cancer or other malignancy, and possible need for additional procedures. We discussed ~5% risk of atonic bladder and persistent need for CIC or foley despite outlet procedure, as well as need for long term monitoring of renal function.  HoLEP today  BNickolas Madrid MD 07/05/2022  [facility] [address] [place], [place] [address] [phone]

## 2022-07-05 NOTE — Discharge Instructions (Addendum)
AMBULATORY SURGERY  DISCHARGE INSTRUCTIONS   The drugs that you were given will stay in your system until tomorrow so for the next 24 hours you should not:  Drive an automobile Make any legal decisions Drink any alcoholic beverage   You may resume regular meals tomorrow.  Today it is better to start with liquids and gradually work up to solid foods.  You may eat anything you prefer, but it is better to start with liquids, then soup and crackers, and gradually work up to solid foods.   Please notify your doctor immediately if you have any unusual bleeding, trouble breathing, redness and pain at the surgery site, drainage, fever, or pain not relieved by medication.                    

## 2022-07-08 ENCOUNTER — Ambulatory Visit: Payer: BC Managed Care – PPO | Admitting: Physician Assistant

## 2022-07-08 ENCOUNTER — Ambulatory Visit (INDEPENDENT_AMBULATORY_CARE_PROVIDER_SITE_OTHER): Payer: BC Managed Care – PPO | Admitting: Urology

## 2022-07-08 ENCOUNTER — Encounter: Payer: Self-pay | Admitting: Urology

## 2022-07-08 DIAGNOSIS — N138 Other obstructive and reflux uropathy: Secondary | ICD-10-CM

## 2022-07-08 DIAGNOSIS — N401 Enlarged prostate with lower urinary tract symptoms: Secondary | ICD-10-CM

## 2022-07-08 LAB — BLADDER SCAN AMB NON-IMAGING: Scan Result: 73

## 2022-07-08 NOTE — Progress Notes (Signed)
Catheter Removal  Patient is present today for a catheter removal.  15ml of water was drained from the balloon. A 24FR foley cath was removed from the bladder, no complications were noted. Patient tolerated well.  Performed by: Elberta Leatherwood, CMA  Follow up/ Additional notes: PM PVR   PVR was performed residual is 28ml.

## 2022-07-08 NOTE — Anesthesia Postprocedure Evaluation (Signed)
Anesthesia Post Note  Patient: Mivaan Corbitt  Procedure(s) Performed: HOLEP-LASER ENUCLEATION OF THE PROSTATE WITH MORCELLATION (Prostate)  Patient location during evaluation: PACU Anesthesia Type: General Level of consciousness: awake and alert Pain management: pain level controlled Vital Signs Assessment: post-procedure vital signs reviewed and stable Respiratory status: spontaneous breathing, nonlabored ventilation, respiratory function stable and patient connected to nasal cannula oxygen Cardiovascular status: blood pressure returned to baseline and stable Postop Assessment: no apparent nausea or vomiting Anesthetic complications: no   No notable events documented.   Last Vitals:  Vitals:   07/05/22 1325 07/05/22 1337  BP:  (!) 160/97  Pulse: 73 94  Resp: 10 16  Temp:  (!) 36.2 C  SpO2: 99% 100%    Last Pain:  Vitals:   07/08/22 0842  TempSrc:   PainSc: 0-No pain                 Dimas Millin

## 2022-07-11 LAB — SURGICAL PATHOLOGY

## 2022-07-16 ENCOUNTER — Ambulatory Visit: Payer: BC Managed Care – PPO | Admitting: Urology

## 2022-07-17 ENCOUNTER — Telehealth: Payer: Self-pay

## 2022-07-17 NOTE — Telephone Encounter (Signed)
Patient called in needing a note sent to him covering him from being out of work for 1 week after surgery. Sent via [employer].

## 2022-09-24 ENCOUNTER — Ambulatory Visit (INDEPENDENT_AMBULATORY_CARE_PROVIDER_SITE_OTHER): Payer: BC Managed Care – PPO | Admitting: Urology

## 2022-09-24 VITALS — BP 136/88 | HR 104 | Wt 170.0 lb

## 2022-09-24 DIAGNOSIS — N138 Other obstructive and reflux uropathy: Secondary | ICD-10-CM

## 2022-09-24 DIAGNOSIS — N401 Enlarged prostate with lower urinary tract symptoms: Secondary | ICD-10-CM

## 2022-09-24 LAB — BLADDER SCAN AMB NON-IMAGING

## 2022-09-24 NOTE — Progress Notes (Addendum)
   09/24/2022 9:46 AM   Mario Valdez [birth year] [id number]  Reason for visit: Follow up BPH and retention, CKD  HPI: 67 year old male with a history of an elevated PSA of 8.6 and negative prostate biopsy in 2021 with Dr. Junious Silk that showed a 100 g prostate with only benign tissue.  He was lost to follow-up and ultimately underwent a CT for lower abdominal distention which showed a distended bladder with bilateral hydroureteronephrosis down to the bladder.  He also had worsening renal function from a baseline creatinine of 1.8, EGFR 38 in March 2023 to a creatinine of 4.06 EGFR 16 in October 2023.  He underwent HOLEP on 07/05/2022 with removal of 60 g of benign tissue.  He is doing very well and is urinating with a strong stream, having nocturia 0-1 time overnight, denies any incontinence.  PVR is normal today at 18ml.  Renal function stable at 2.15(eGFR 33) from November 2023, and he continues to follow with nephrology.  He is off blood pressure medications, and he checks his blood pressure daily and has been around 125/80.  Return precautions discussed, RTC 1 year PVR, can follow-up as needed at that time if doing well   [place], [place] [address] [place], [place] [address] [phone]

## 2023-09-29 ENCOUNTER — Other Ambulatory Visit: Payer: Self-pay

## 2023-09-29 DIAGNOSIS — N138 Other obstructive and reflux uropathy: Secondary | ICD-10-CM

## 2023-09-29 DIAGNOSIS — R972 Elevated prostate specific antigen [PSA]: Secondary | ICD-10-CM

## 2023-09-30 ENCOUNTER — Ambulatory Visit (INDEPENDENT_AMBULATORY_CARE_PROVIDER_SITE_OTHER): Payer: BC Managed Care – PPO | Admitting: Urology

## 2023-09-30 VITALS — BP 139/86 | HR 80 | Ht 70.0 in | Wt 175.6 lb

## 2023-09-30 DIAGNOSIS — N138 Other obstructive and reflux uropathy: Secondary | ICD-10-CM

## 2023-09-30 DIAGNOSIS — N401 Enlarged prostate with lower urinary tract symptoms: Secondary | ICD-10-CM | POA: Diagnosis not present

## 2023-09-30 LAB — BLADDER SCAN AMB NON-IMAGING

## 2023-09-30 NOTE — Progress Notes (Signed)
   09/30/2023 8:17 AM   Mario Valdez October 09, 1955 [id number]  Reason for visit: Follow up BPH and retention, CKD, PSA screening  HPI: 68 year old male with a history of an elevated PSA of 8.6 and negative prostate biopsy in 2021 with Dr. Nieves that showed a 100 g prostate with only benign tissue.  He was lost to follow-up and ultimately underwent a CT for lower abdominal distention which showed a distended bladder with bilateral hydroureteronephrosis down to the bladder.  He also had worsening renal function from a baseline creatinine of 1.8, EGFR 38 in March 2023 to a creatinine of 4.06 EGFR 16 in October 2023.  He underwent HOLEP on 07/05/2022 with removal of 60 g of benign tissue.  He is doing very well and is urinating with a strong stream, having nocturia 0-1 time overnight, denies any incontinence.  PVR is normal today at 30ml.  PSA dropped appropriately after HOLEP to 1.69 from April 2024.  Can continue PSA monitoring every 2 to 4 years through age 43.  Renal function improved with most creatinine from September 2024 to 1.61, EGFR 47 from 2.15(eGFR 33) from November 2023, and he continues to follow with nephrology.  He is off blood pressure medications.  Follow-up as needed, return precautions were discussed   Mario JAYSON Burnet, MD  [facility] [address] [place], [place] [address] [phone]
